# Patient Record
Sex: Female | Born: 1935 | Race: White | Hispanic: No | Smoking: Never smoker
Health system: Southern US, Community
[De-identification: ages and names within clinical notes are randomized; demographics above are authoritative.]

## PROBLEM LIST (undated history)

## (undated) DIAGNOSIS — E079 Disorder of thyroid, unspecified: Secondary | ICD-10-CM

## (undated) DIAGNOSIS — I10 Essential (primary) hypertension: Secondary | ICD-10-CM

## (undated) DIAGNOSIS — I4891 Unspecified atrial fibrillation: Secondary | ICD-10-CM

## (undated) DIAGNOSIS — F0390 Unspecified dementia without behavioral disturbance: Secondary | ICD-10-CM

## (undated) HISTORY — PX: HIP SURGERY: SHX245

---

## 2003-07-21 ENCOUNTER — Other Ambulatory Visit: Payer: Self-pay

## 2004-12-20 ENCOUNTER — Ambulatory Visit: Payer: Self-pay | Admitting: Internal Medicine

## 2006-01-16 ENCOUNTER — Ambulatory Visit: Payer: Self-pay | Admitting: Internal Medicine

## 2007-01-21 ENCOUNTER — Ambulatory Visit: Payer: Self-pay | Admitting: Internal Medicine

## 2007-05-15 ENCOUNTER — Ambulatory Visit: Payer: Self-pay | Admitting: Internal Medicine

## 2007-05-23 ENCOUNTER — Ambulatory Visit: Payer: Self-pay | Admitting: Internal Medicine

## 2007-05-24 ENCOUNTER — Ambulatory Visit: Payer: Self-pay | Admitting: Internal Medicine

## 2009-09-02 ENCOUNTER — Ambulatory Visit: Payer: Self-pay | Admitting: Unknown Physician Specialty

## 2009-10-04 ENCOUNTER — Ambulatory Visit: Payer: Self-pay | Admitting: Unknown Physician Specialty

## 2010-04-09 ENCOUNTER — Inpatient Hospital Stay: Payer: Self-pay | Admitting: Specialist

## 2010-04-12 ENCOUNTER — Encounter: Payer: Self-pay | Admitting: Internal Medicine

## 2010-04-12 LAB — PATHOLOGY REPORT

## 2010-04-27 ENCOUNTER — Ambulatory Visit: Payer: Self-pay | Admitting: Internal Medicine

## 2010-05-03 ENCOUNTER — Encounter: Payer: Self-pay | Admitting: Internal Medicine

## 2010-05-20 ENCOUNTER — Ambulatory Visit: Payer: Self-pay | Admitting: Internal Medicine

## 2010-06-02 ENCOUNTER — Encounter: Payer: Self-pay | Admitting: Internal Medicine

## 2012-03-28 IMAGING — CR DG HIP COMPLETE 2+V*L*
1 series · 3 of 3 positions shown · non-contrast
Comparison: none

REASON FOR EXAM: fall with pain
COMMENTS:

PROCEDURE:     DXR - DXR HIP LEFT COMPLETE  - April 09, 2010  [DATE]
RESULT:     Comparison: None.

[Series 1: view not recorded · 0.17mm/px · 3 of 3 slices shown]
[im 1/3]
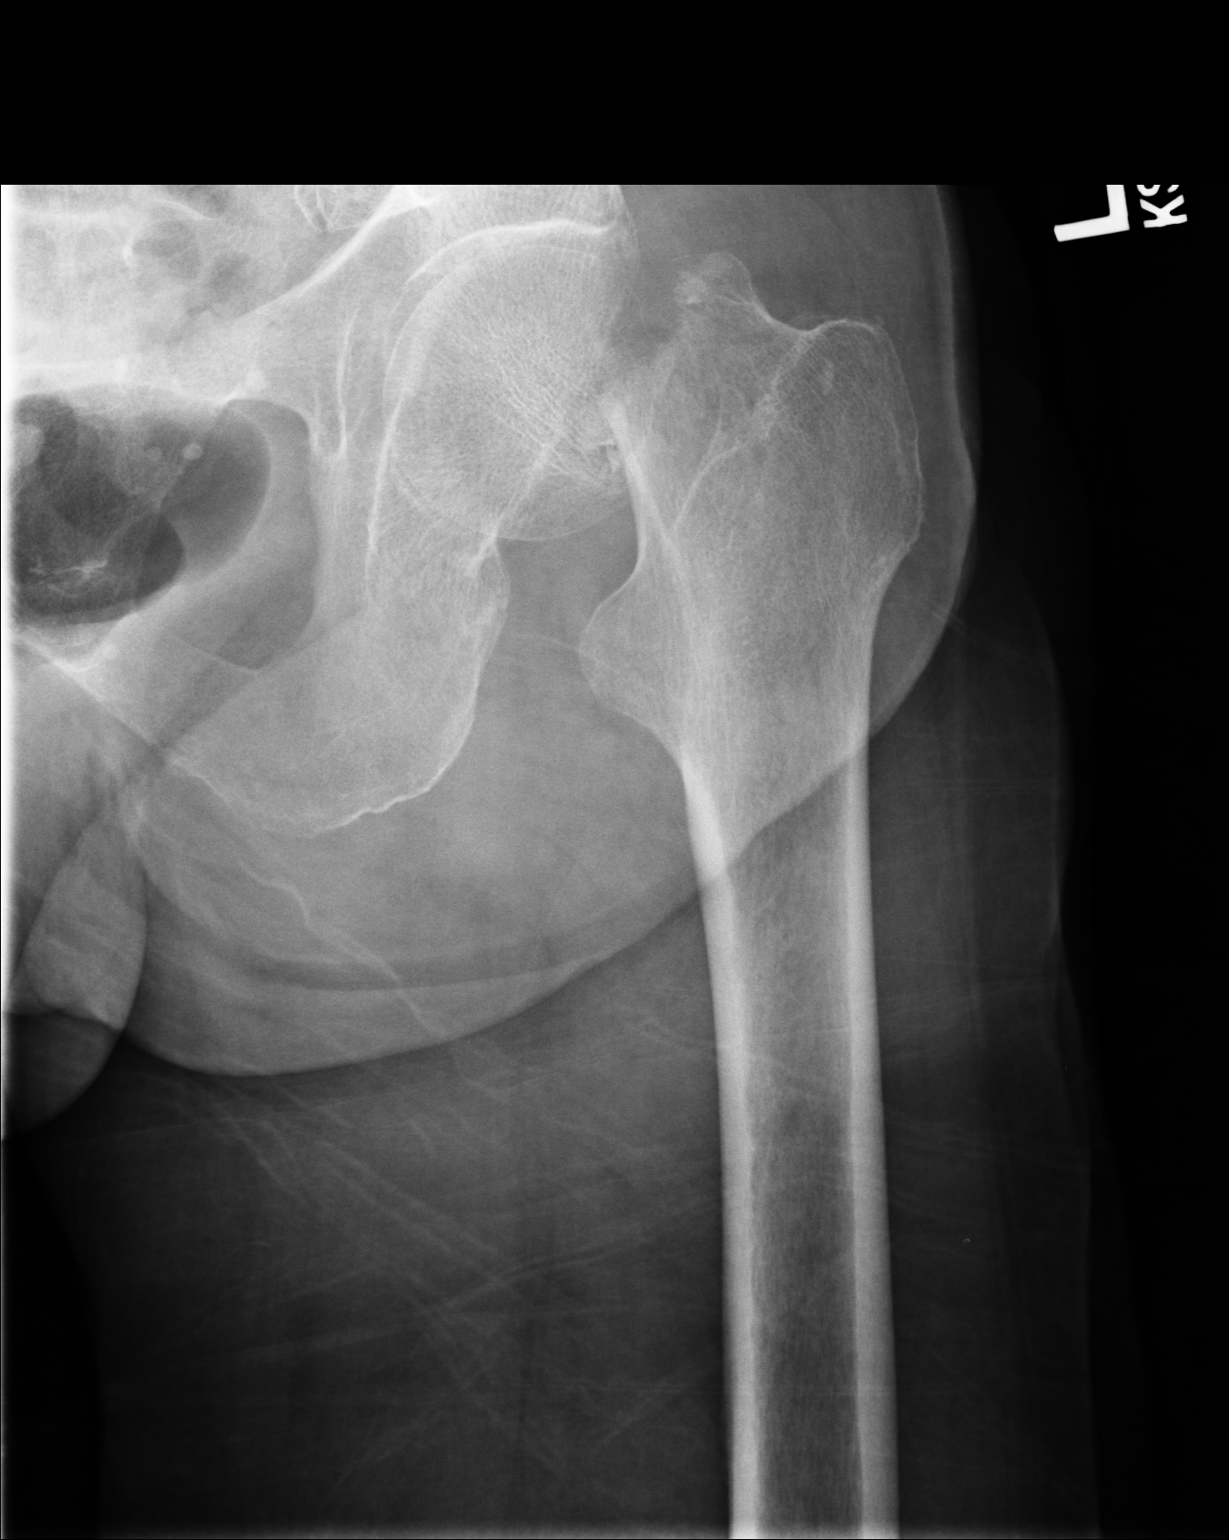
[im 2/3]
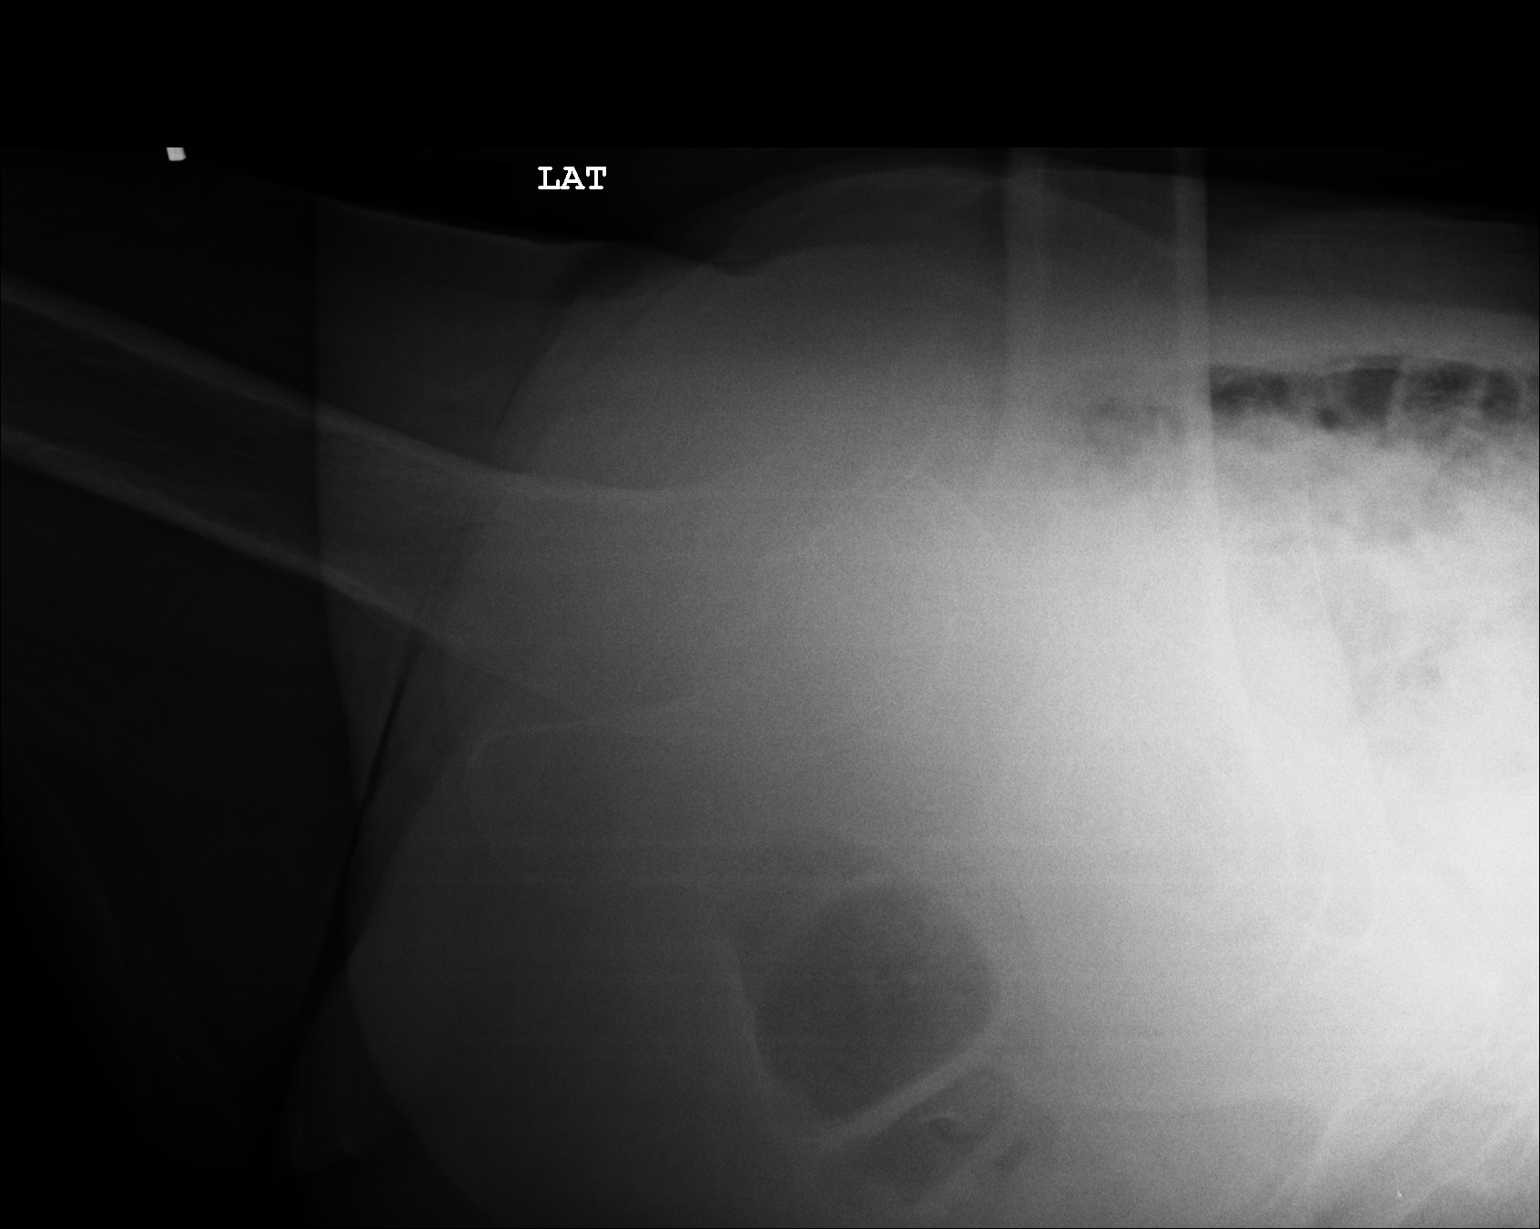
[im 3/3]
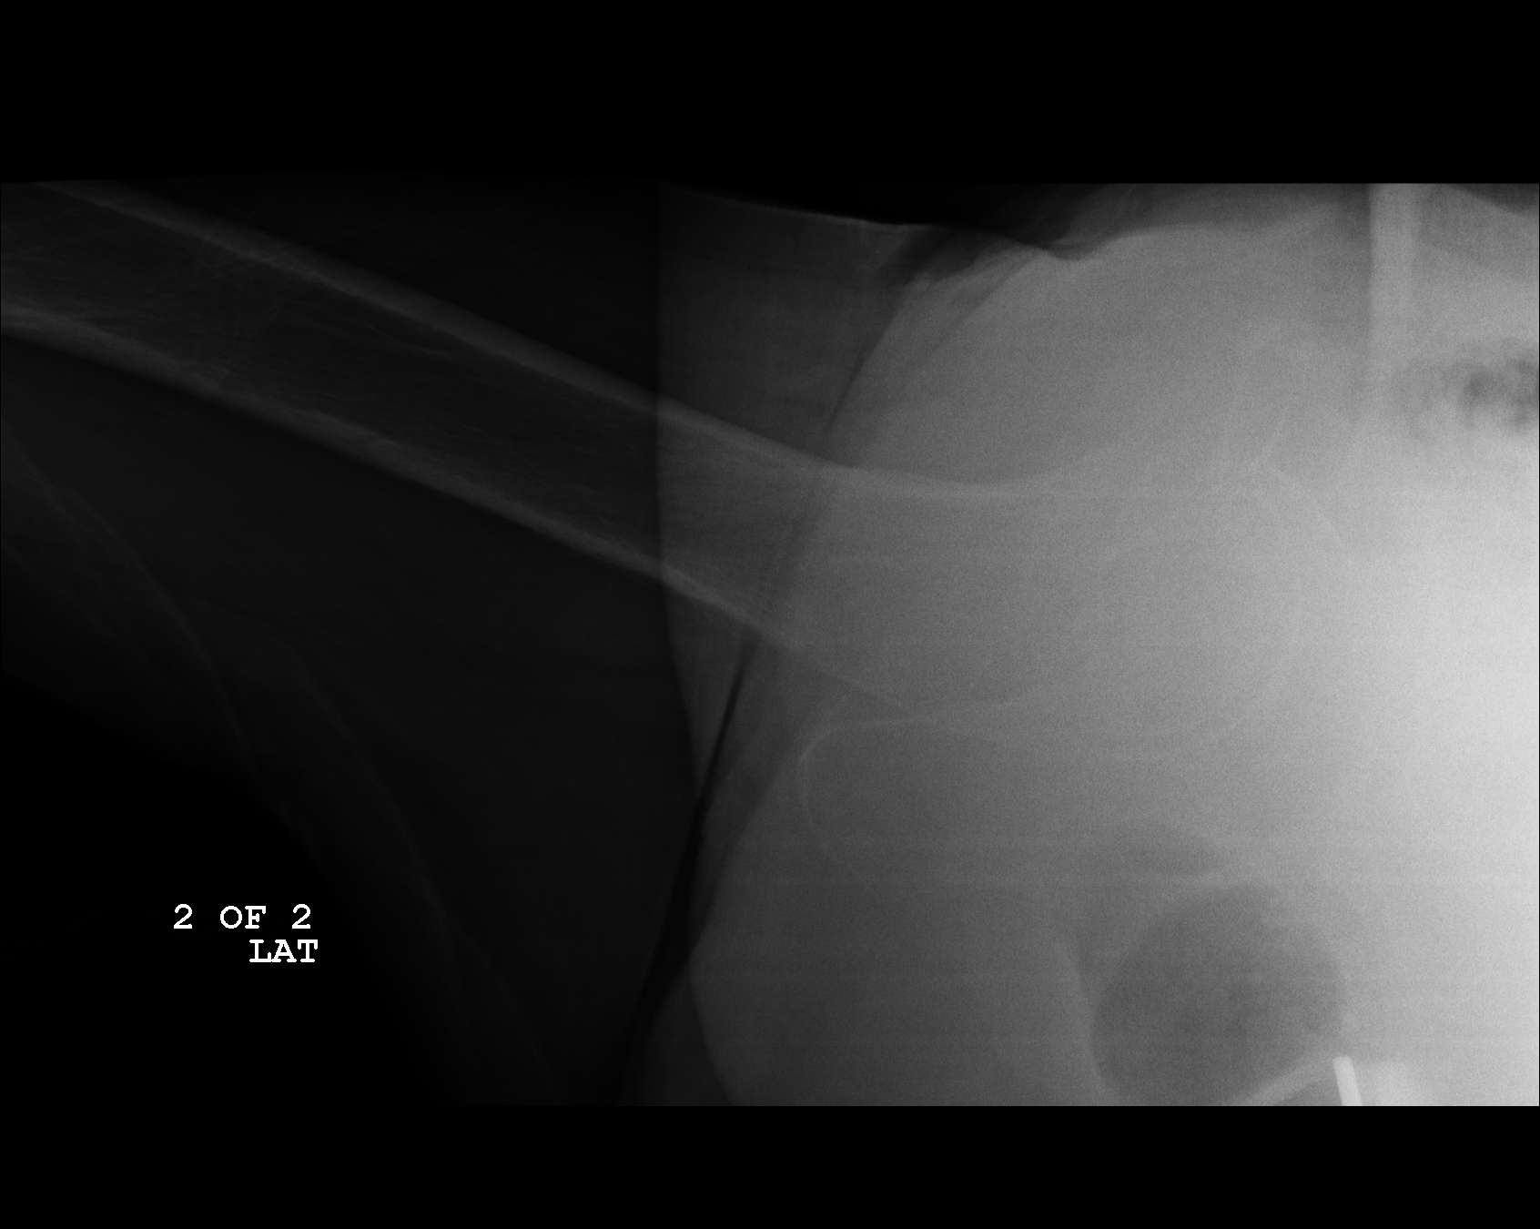

[3 of 3 positions shown; findings below may reference images not displayed]

FINDINGS: There is a displaced fracture the femoral neck, just below the femoral head.
IMPRESSION: Displaced subcapital femoral neck fracture.

## 2012-03-28 IMAGING — CR PELVIS - 1-2 VIEW
1 series · 1 of 1 positions shown · non-contrast
Comparison: none

REASON FOR EXAM: fall with pain
COMMENTS:

PROCEDURE:     DXR - DXR PELVIS AP ONLY  - April 09, 2010  [DATE]
RESULT:     Comparison: None

[view not recorded]
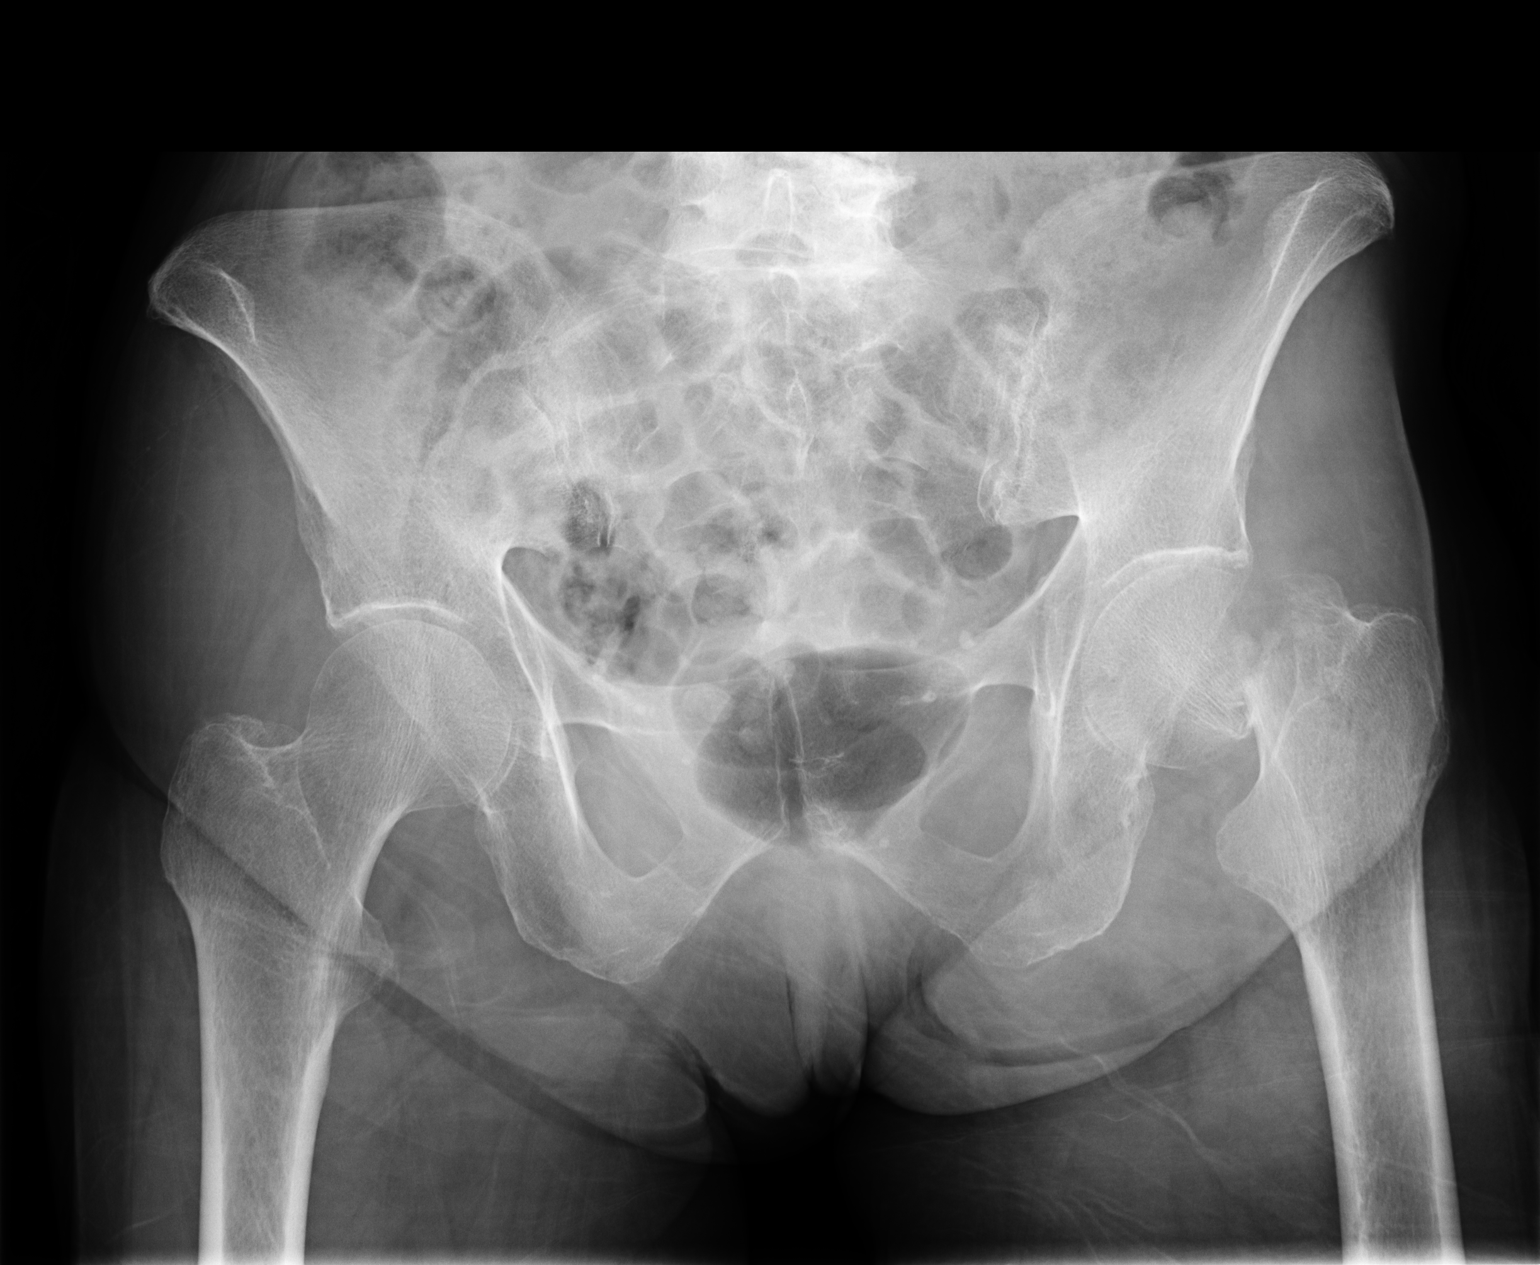

[1 of 1 positions shown; findings below may reference images not displayed]

FINDINGS: Displaced subcapital femoral neck fracture. Evaluation of the sacrum limited
by overlying stool and bowel gas. There are degenerative changes in the
lower lumbar spine.
IMPRESSION: Displaced left subcapital femoral neck fracture.

## 2012-03-28 IMAGING — CR DG CHEST 1V PORT
1 series · 1 of 1 positions shown · non-contrast
Comparison: none

REASON FOR EXAM: preop
COMMENTS:

PROCEDURE:     DXR - DXR PORTABLE CHEST SINGLE VIEW  - April 09, 2010  [DATE]
RESULT:     Comparison: None.

[view not recorded]
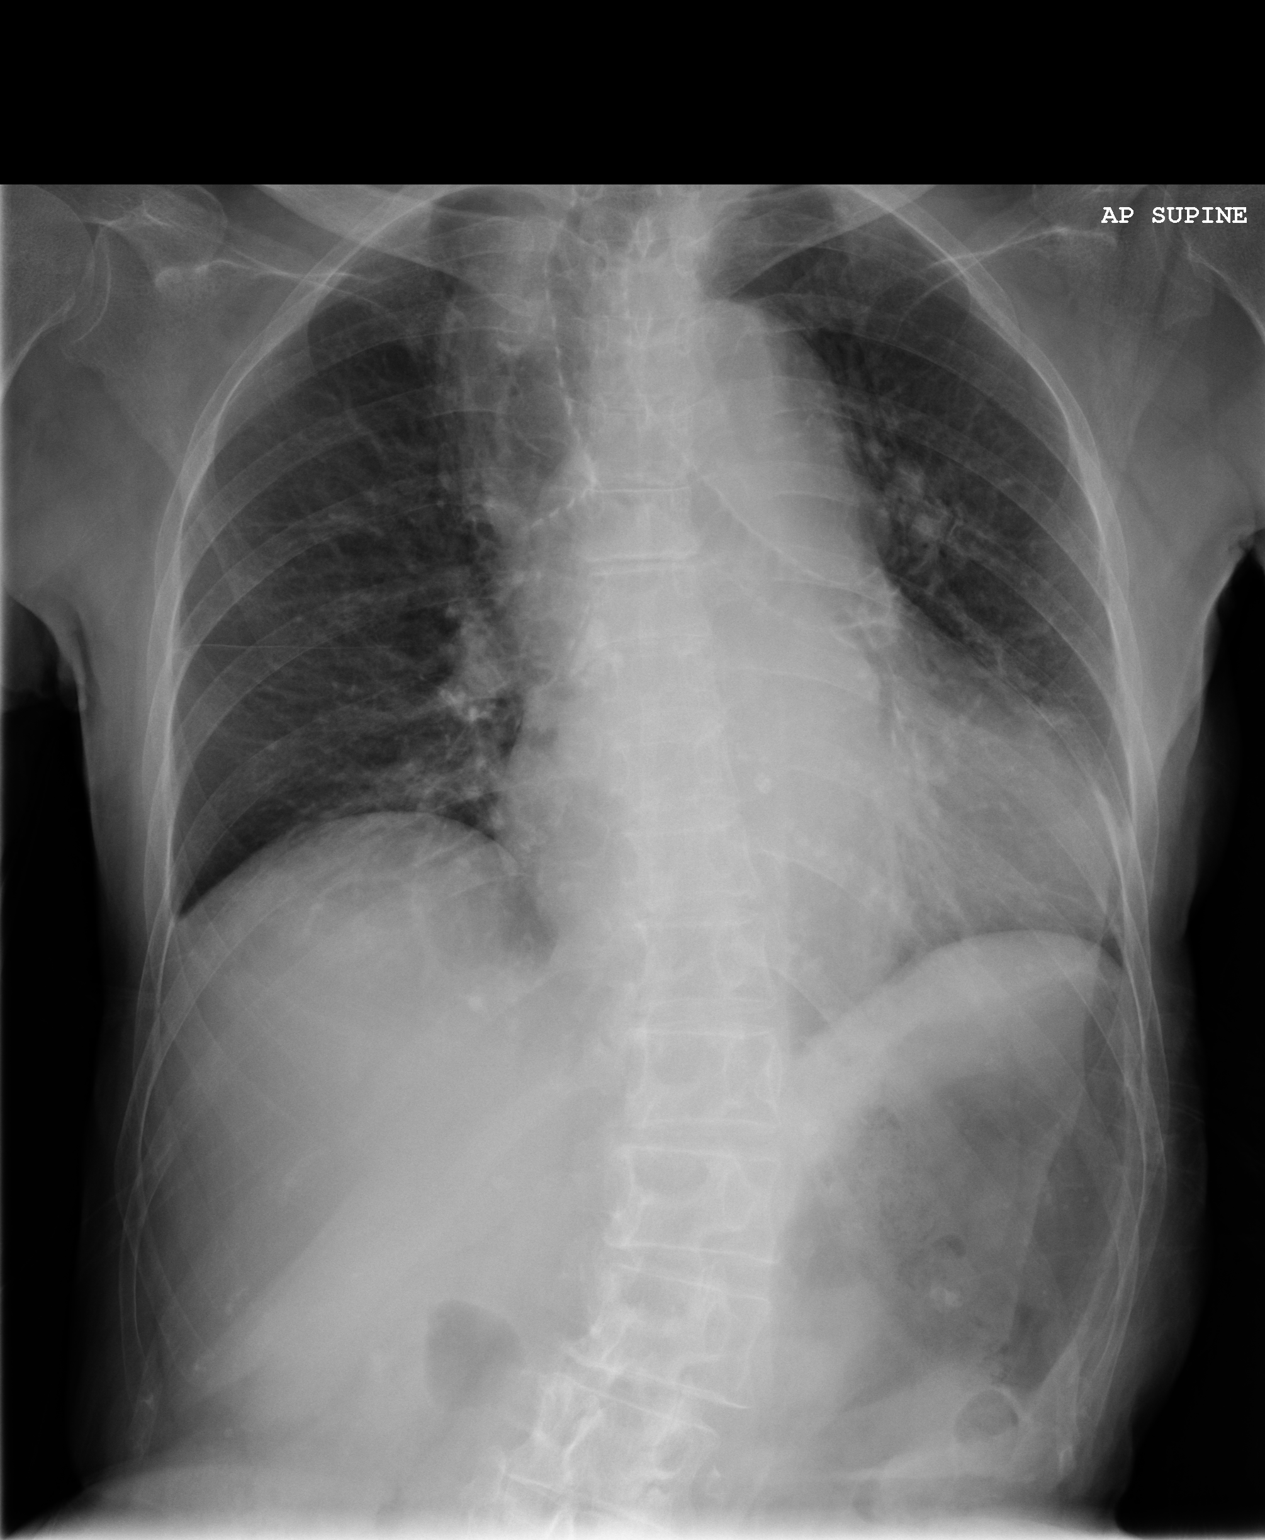

[1 of 1 positions shown; findings below may reference images not displayed]

FINDINGS: Mild cardiomegaly. Prominence of the right superior mediastinum is likely
related to tortuous great vessels. The lungs are clear. There is an S-shaped
curvature of the thoracolumbar spine.
IMPRESSION: 1. No acute cardiopulmonary disease.
2. Prominence of the right superior mediastinum likely related to tortuous
great vessels. Recommend comparison with outside prior chest radiographs to
ensure stability.

## 2012-03-28 IMAGING — CR DG HIP 1V*L*
1 series · 1 of 1 positions shown · non-contrast
Comparison: none

REASON FOR EXAM: post op
COMMENTS:   Bedside (portable):Y

PROCEDURE:     DXR - DXR HIP LEFT ONE VIEW  - April 09, 2010  [DATE]
RESULT:     Comparison: Earlier same day.

[view not recorded]
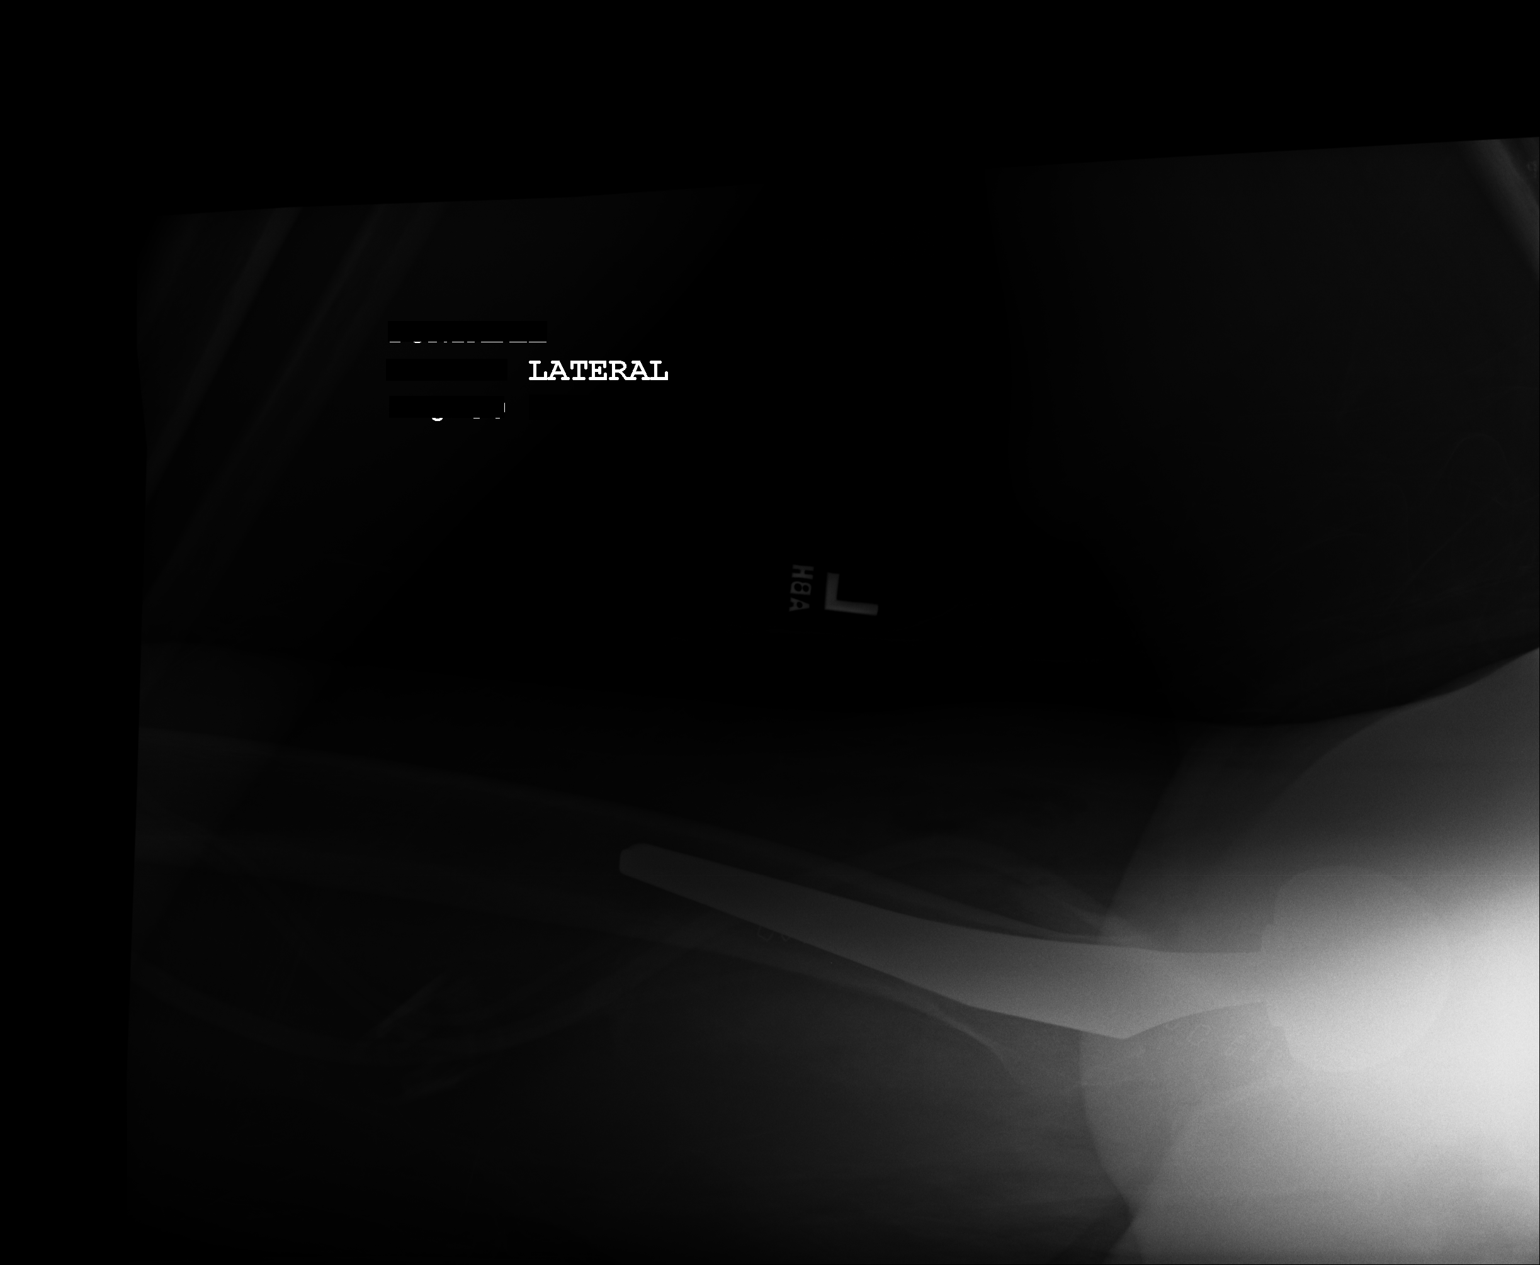

[1 of 1 positions shown; findings below may reference images not displayed]

FINDINGS: Evaluation limited by underpenetration. The femoral arthroplasty hardware
appears located, given the findings on the pelvis radiograph.
IMPRESSION: See above.

## 2012-04-15 IMAGING — CR DG ABDOMEN 3V
1 series · 4 of 4 positions shown · non-contrast
Comparison: none

REASON FOR EXAM: pain in lt lower side of abdomen
COMMENTS:

[Series 1: view not recorded · 0.17mm/px · 4 of 4 slices shown]
[im 1/4]
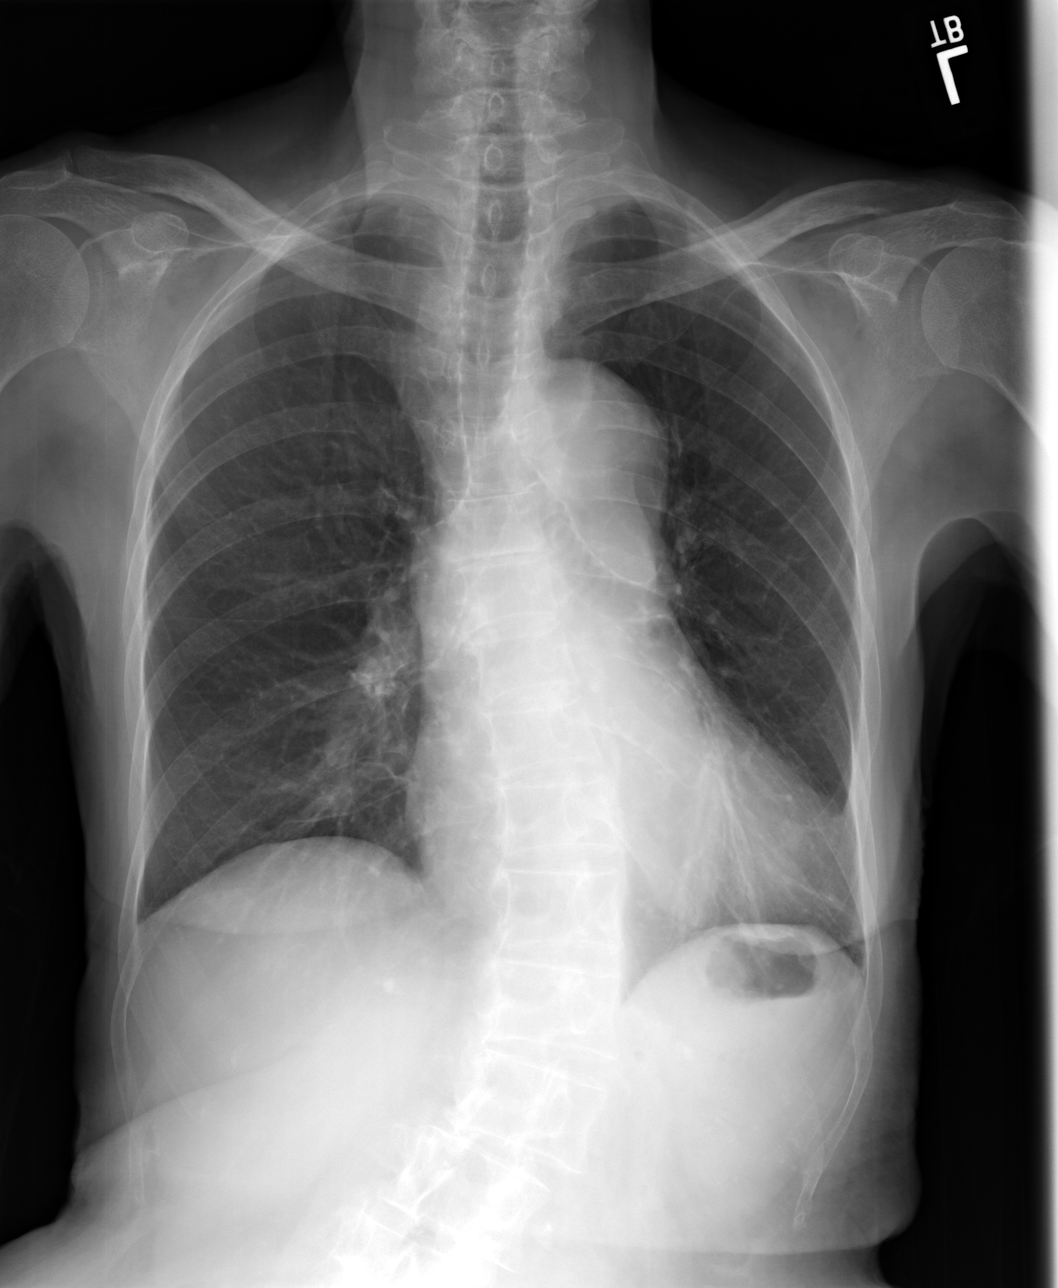
[im 2/4]
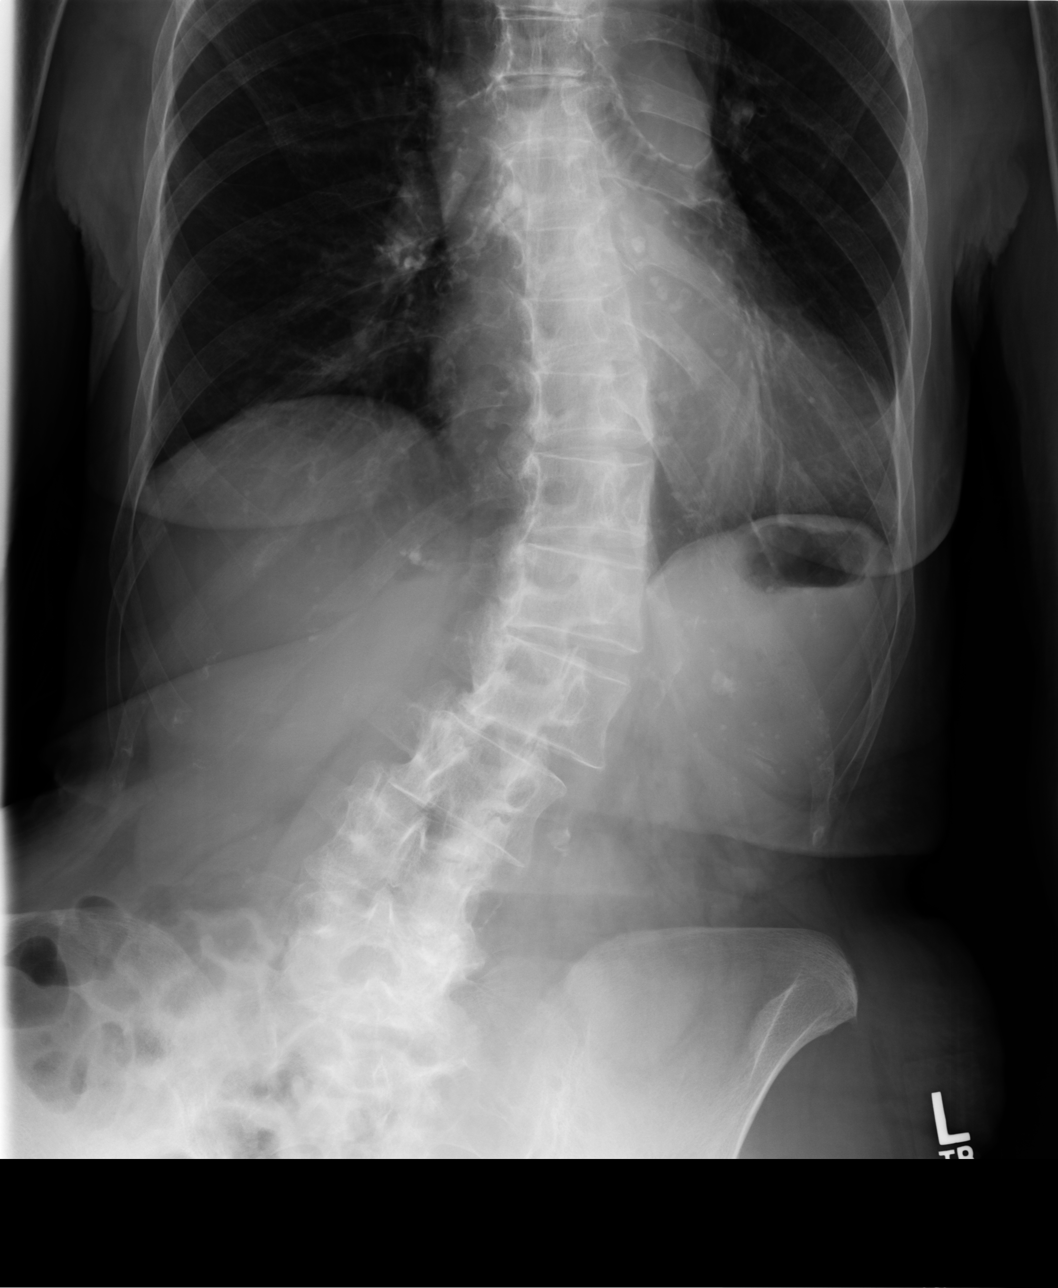
[im 3/4]
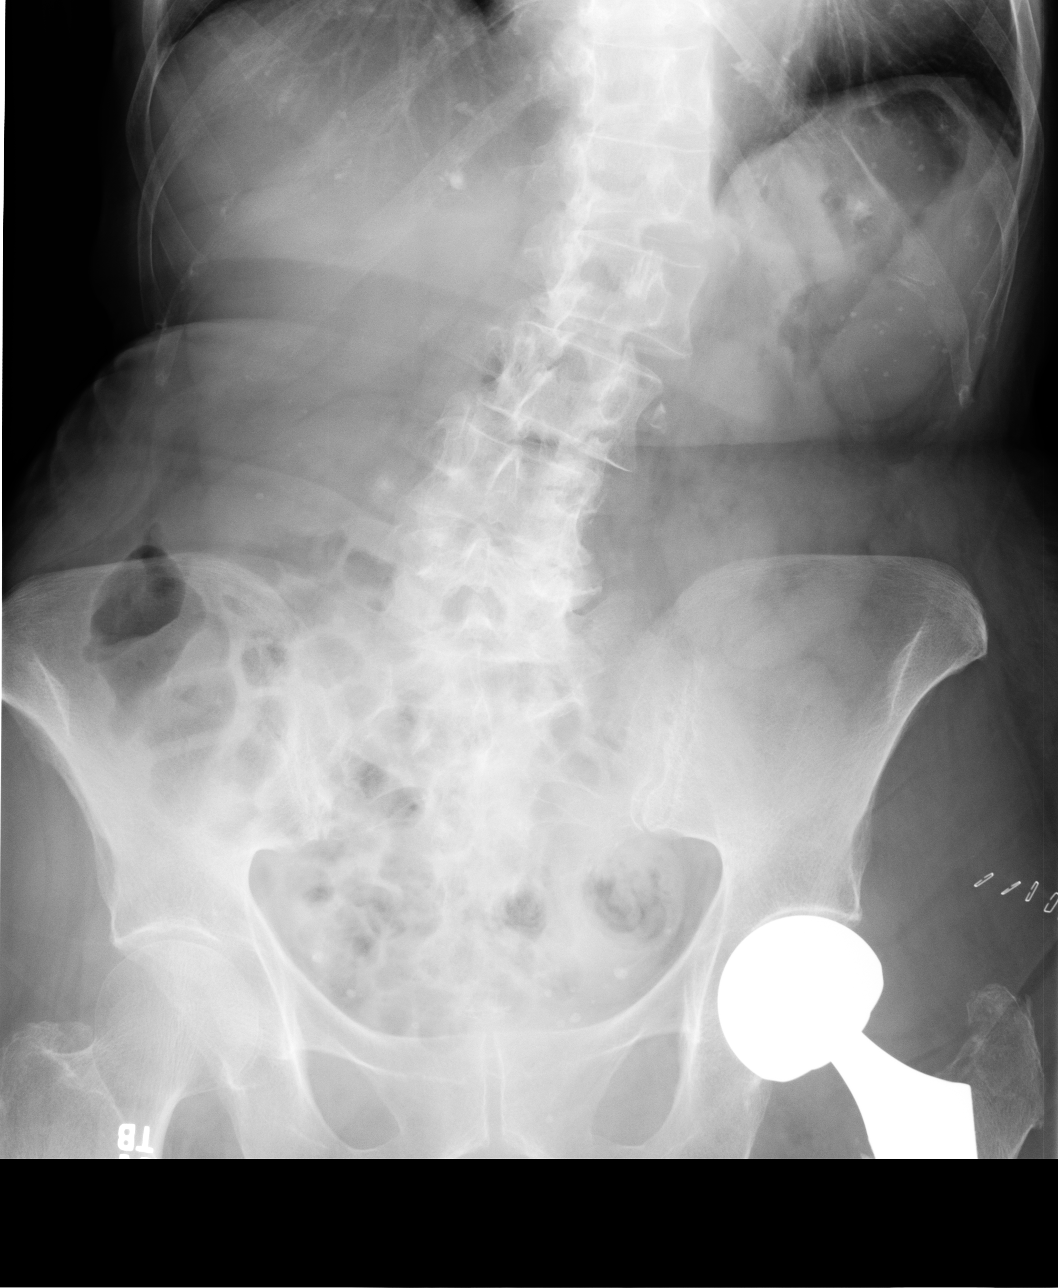
[im 4/4]
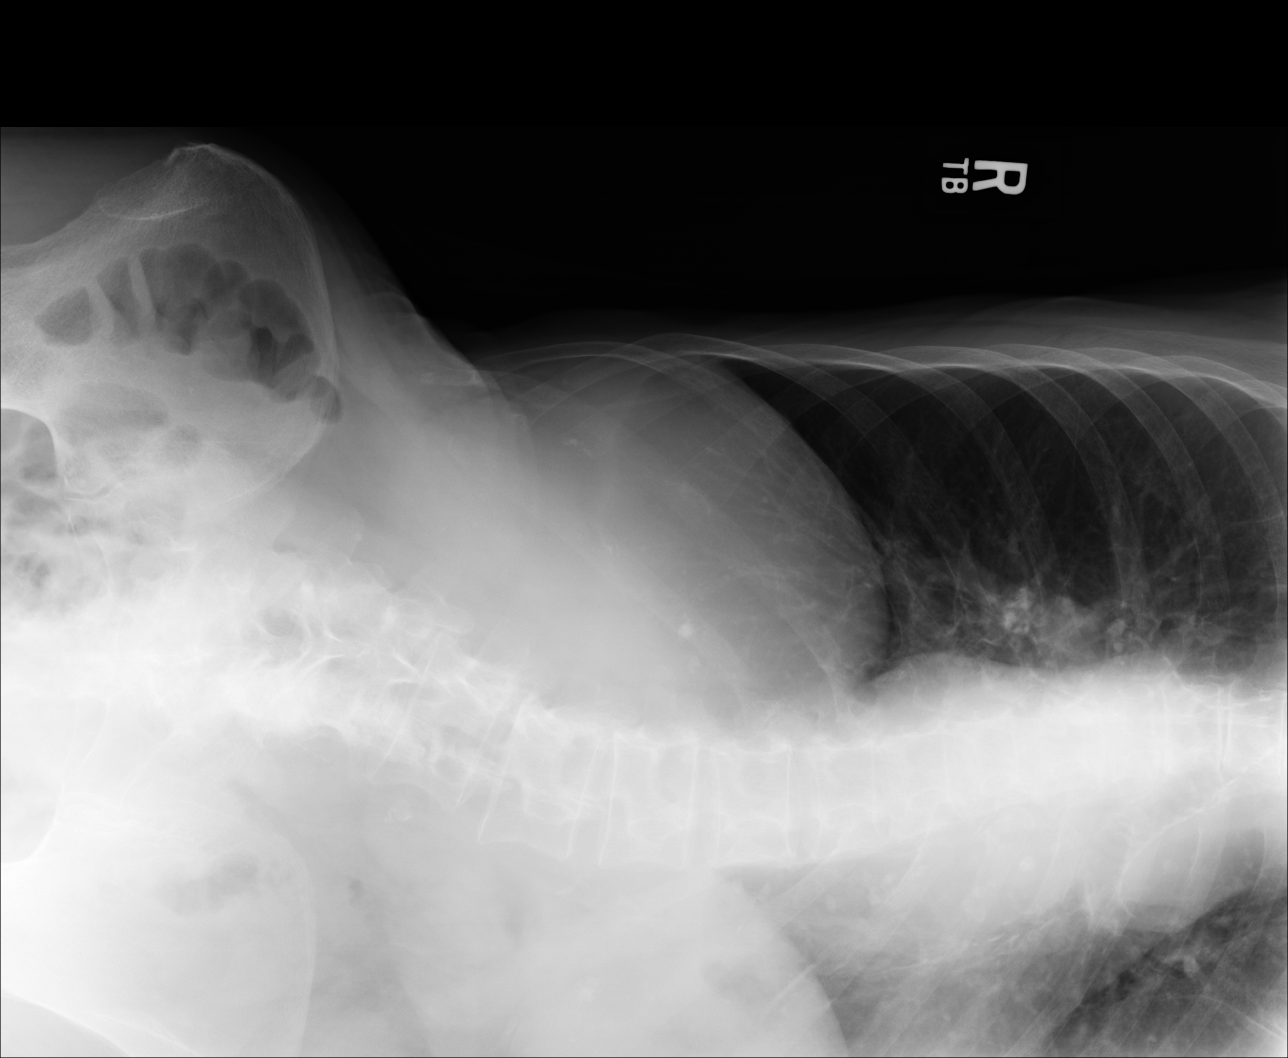

[4 of 4 positions shown; findings below may reference images not displayed]

PROCEDURE:     DXR - DXR ABDOMEN 3-WAY (INCL PA CXR)  - April 27, 2010 [DATE]

RESULT:     PA view of the chest shows the lung fields to be clear. The
heart is mildly enlarged. Multiple views of the abdomen show no
subdiaphragmatic free air. The bowel gas pattern is normal. No abnormal
intraabdominal calcifications are seen. There is a moderate thoracolumbar
scoliosis with the convexity to the left. The patient is status post prior
left hip replacement.
IMPRESSION: 1. No bowel obstruction or other acute change is seen.
2. No subdiaphragmatic free air is noted.
3. There is a moderate lumbar scoliosis. No acute bony abnormalities are
seen.
4. Mild cardiomegaly.

## 2015-08-21 ENCOUNTER — Encounter: Payer: Self-pay | Admitting: Emergency Medicine

## 2015-08-21 ENCOUNTER — Emergency Department: Payer: Medicare Other

## 2015-08-21 ENCOUNTER — Observation Stay
Admission: EM | Admit: 2015-08-21 | Discharge: 2015-08-24 | Disposition: A | Discharge status: Skilled Nursing Facility | Payer: Medicare Other | Attending: Internal Medicine | Admitting: Internal Medicine

## 2015-08-21 DIAGNOSIS — Z9889 Other specified postprocedural states: Secondary | ICD-10-CM | POA: Insufficient documentation

## 2015-08-21 DIAGNOSIS — R778 Other specified abnormalities of plasma proteins: Secondary | ICD-10-CM

## 2015-08-21 DIAGNOSIS — R410 Disorientation, unspecified: Secondary | ICD-10-CM | POA: Insufficient documentation

## 2015-08-21 DIAGNOSIS — Z79899 Other long term (current) drug therapy: Secondary | ICD-10-CM | POA: Diagnosis not present

## 2015-08-21 DIAGNOSIS — I10 Essential (primary) hypertension: Secondary | ICD-10-CM | POA: Insufficient documentation

## 2015-08-21 DIAGNOSIS — R7989 Other specified abnormal findings of blood chemistry: Secondary | ICD-10-CM

## 2015-08-21 DIAGNOSIS — I482 Chronic atrial fibrillation: Secondary | ICD-10-CM | POA: Insufficient documentation

## 2015-08-21 DIAGNOSIS — E876 Hypokalemia: Secondary | ICD-10-CM | POA: Insufficient documentation

## 2015-08-21 DIAGNOSIS — R748 Abnormal levels of other serum enzymes: Secondary | ICD-10-CM | POA: Insufficient documentation

## 2015-08-21 DIAGNOSIS — Z8249 Family history of ischemic heart disease and other diseases of the circulatory system: Secondary | ICD-10-CM | POA: Diagnosis not present

## 2015-08-21 DIAGNOSIS — B351 Tinea unguium: Secondary | ICD-10-CM | POA: Insufficient documentation

## 2015-08-21 DIAGNOSIS — E079 Disorder of thyroid, unspecified: Secondary | ICD-10-CM | POA: Diagnosis not present

## 2015-08-21 DIAGNOSIS — G9340 Encephalopathy, unspecified: Secondary | ICD-10-CM | POA: Diagnosis not present

## 2015-08-21 DIAGNOSIS — E860 Dehydration: Secondary | ICD-10-CM | POA: Diagnosis not present

## 2015-08-21 DIAGNOSIS — E039 Hypothyroidism, unspecified: Secondary | ICD-10-CM | POA: Insufficient documentation

## 2015-08-21 DIAGNOSIS — N3001 Acute cystitis with hematuria: Secondary | ICD-10-CM | POA: Insufficient documentation

## 2015-08-21 DIAGNOSIS — R531 Weakness: Secondary | ICD-10-CM

## 2015-08-21 HISTORY — DX: Essential (primary) hypertension: I10

## 2015-08-21 HISTORY — DX: Disorder of thyroid, unspecified: E07.9

## 2015-08-21 HISTORY — DX: Unspecified atrial fibrillation: I48.91

## 2015-08-21 LAB — BASIC METABOLIC PANEL
ANION GAP: 6 (ref 5–15)
BUN: 37 mg/dL — ABNORMAL HIGH (ref 6–20)
CALCIUM: 9.1 mg/dL (ref 8.9–10.3)
CO2: 31 mmol/L (ref 22–32)
CREATININE: 0.94 mg/dL (ref 0.44–1.00)
Chloride: 104 mmol/L (ref 101–111)
GFR, EST AFRICAN AMERICAN: 60 mL/min — AB (ref >60)
GFR, EST NON AFRICAN AMERICAN: 51 mL/min — AB (ref >60)
Glucose, Bld: 145 mg/dL — ABNORMAL HIGH (ref 65–99)
Potassium: 3.5 mmol/L (ref 3.5–5.1)
SODIUM: 141 mmol/L (ref 135–145)

## 2015-08-21 LAB — TROPONIN I: TROPONIN I: 0.14 ng/mL — AB (ref <0.031)

## 2015-08-21 LAB — CBC
HCT: 34 % — ABNORMAL LOW (ref 35.0–47.0)
HEMOGLOBIN: 11.4 g/dL — AB (ref 12.0–16.0)
MCH: 33.5 pg (ref 26.0–34.0)
MCHC: 33.6 g/dL (ref 32.0–36.0)
MCV: 99.6 fL (ref 80.0–100.0)
PLATELETS: 90 10*3/uL — AB (ref 150–440)
RBC: 3.41 MIL/uL — AB (ref 3.80–5.20)
RDW: 13 % (ref 11.5–14.5)
WBC: 5.5 10*3/uL (ref 3.6–11.0)

## 2015-08-21 MED ORDER — SODIUM CHLORIDE 0.9 % IV BOLUS (SEPSIS)
1000.0000 mL | Freq: Once | INTRAVENOUS | Status: AC
  Administered 2015-08-22: 1000 mL via INTRAVENOUS

## 2015-08-21 NOTE — ED Notes (Signed)
Pt. Lives at home alone. Pt. States feeling weaker today.  Pt. Denies n/v/d.

## 2015-08-22 ENCOUNTER — Encounter: Payer: Self-pay | Admitting: Emergency Medicine

## 2015-08-22 DIAGNOSIS — E860 Dehydration: Secondary | ICD-10-CM | POA: Diagnosis present

## 2015-08-22 LAB — TROPONIN I
TROPONIN I: 0.14 ng/mL — AB (ref <0.031)
TROPONIN I: 0.13 ng/mL — AB (ref <0.031)
TROPONIN I: 0.1 ng/mL — AB (ref <0.031)

## 2015-08-22 LAB — URINALYSIS COMPLETE WITH MICROSCOPIC (ARMC ONLY)
BILIRUBIN URINE: NEGATIVE
Bacteria, UA: NONE SEEN
GLUCOSE, UA: NEGATIVE mg/dL
KETONES UR: NEGATIVE mg/dL
NITRITE: NEGATIVE
PH: 7 (ref 5.0–8.0)
PROTEIN: NEGATIVE mg/dL
SPECIFIC GRAVITY, URINE: 1.01 (ref 1.005–1.030)

## 2015-08-22 LAB — DIGOXIN LEVEL

## 2015-08-22 LAB — BASIC METABOLIC PANEL
Anion gap: 6 (ref 5–15)
BUN: 32 mg/dL — AB (ref 6–20)
CALCIUM: 8.9 mg/dL (ref 8.9–10.3)
CO2: 31 mmol/L (ref 22–32)
CREATININE: 0.91 mg/dL (ref 0.44–1.00)
Chloride: 107 mmol/L (ref 101–111)
GFR calc Af Amer: >60 mL/min (ref >60)
GFR, EST NON AFRICAN AMERICAN: 54 mL/min — AB (ref >60)
GLUCOSE: 95 mg/dL (ref 65–99)
Potassium: 3.4 mmol/L — ABNORMAL LOW (ref 3.5–5.1)
SODIUM: 144 mmol/L (ref 135–145)

## 2015-08-22 LAB — CBC
HEMATOCRIT: 31.9 % — AB (ref 35.0–47.0)
Hemoglobin: 10.9 g/dL — ABNORMAL LOW (ref 12.0–16.0)
MCH: 34 pg (ref 26.0–34.0)
MCHC: 34 g/dL (ref 32.0–36.0)
MCV: 100 fL (ref 80.0–100.0)
PLATELETS: 87 10*3/uL — AB (ref 150–440)
RBC: 3.19 MIL/uL — ABNORMAL LOW (ref 3.80–5.20)
RDW: 12.7 % (ref 11.5–14.5)
WBC: 5.2 10*3/uL (ref 3.6–11.0)

## 2015-08-22 MED ORDER — DEXTROSE 5 % IV SOLN
1.0000 g | INTRAVENOUS | Status: DC
  Administered 2015-08-22 – 2015-08-24 (×3): 1 g via INTRAVENOUS
  Filled 2015-08-22 (×4): qty 10

## 2015-08-22 MED ORDER — ONDANSETRON HCL 4 MG/2ML IJ SOLN: 4.0000 mg | Freq: Four times a day (QID) | INTRAMUSCULAR | Status: DC | PRN

## 2015-08-22 MED ORDER — ACETAMINOPHEN 650 MG RE SUPP: 650.0000 mg | Freq: Four times a day (QID) | RECTAL | Status: DC | PRN

## 2015-08-22 MED ORDER — POTASSIUM CHLORIDE CRYS ER 20 MEQ PO TBCR
20.0000 meq | EXTENDED_RELEASE_TABLET | Freq: Once | ORAL | Status: AC
  Administered 2015-08-22: 20 meq via ORAL
  Filled 2015-08-22: qty 1

## 2015-08-22 MED ORDER — LISINOPRIL 20 MG PO TABS
20.0000 mg | ORAL_TABLET | Freq: Every day | ORAL | Status: DC
  Administered 2015-08-22 – 2015-08-24 (×3): 20 mg via ORAL
  Filled 2015-08-22 (×3): qty 1

## 2015-08-22 MED ORDER — ONDANSETRON HCL 4 MG PO TABS
4.0000 mg | ORAL_TABLET | Freq: Four times a day (QID) | ORAL | Status: DC | PRN
Start: 2015-08-22 — End: 2015-08-24

## 2015-08-22 MED ORDER — ENOXAPARIN SODIUM 60 MG/0.6ML ~~LOC~~ SOLN
1.0000 mg/kg | Freq: Every day | SUBCUTANEOUS | Status: DC
Start: 2015-08-22 — End: 2015-08-23
  Administered 2015-08-22: 45 mg via SUBCUTANEOUS
  Filled 2015-08-22 (×4): qty 0.6

## 2015-08-22 MED ORDER — METOPROLOL TARTRATE 25 MG PO TABS
25.0000 mg | ORAL_TABLET | Freq: Every day | ORAL | Status: DC
  Filled 2015-08-22: qty 1

## 2015-08-22 MED ORDER — SODIUM CHLORIDE 0.9% FLUSH
3.0000 mL | Freq: Two times a day (BID) | INTRAVENOUS | Status: DC
  Administered 2015-08-22 – 2015-08-24 (×4): 3 mL via INTRAVENOUS

## 2015-08-22 MED ORDER — SENNA 8.6 MG PO TABS
1.0000 | ORAL_TABLET | Freq: Two times a day (BID) | ORAL | Status: DC
  Administered 2015-08-22 – 2015-08-24 (×5): 8.6 mg via ORAL
  Filled 2015-08-22 (×5): qty 1

## 2015-08-22 MED ORDER — ASPIRIN 81 MG PO CHEW
324.0000 mg | CHEWABLE_TABLET | Freq: Once | ORAL | Status: AC
  Administered 2015-08-22: 324 mg via ORAL

## 2015-08-22 MED ORDER — SODIUM CHLORIDE 0.9 % IV SOLN
INTRAVENOUS | Status: DC
Start: 2015-08-22 — End: 2015-08-23
  Administered 2015-08-22 (×2): via INTRAVENOUS

## 2015-08-22 MED ORDER — LEVOTHYROXINE SODIUM 75 MCG PO TABS
75.0000 ug | ORAL_TABLET | Freq: Every day | ORAL | Status: DC
  Administered 2015-08-22 – 2015-08-24 (×3): 75 ug via ORAL
  Filled 2015-08-22 (×3): qty 1

## 2015-08-22 MED ORDER — ASPIRIN 81 MG PO CHEW
CHEWABLE_TABLET | ORAL | Status: AC
  Administered 2015-08-22: 324 mg via ORAL
  Filled 2015-08-22: qty 4

## 2015-08-22 MED ORDER — AMLODIPINE BESYLATE 5 MG PO TABS
5.0000 mg | ORAL_TABLET | Freq: Every day | ORAL | Status: DC
  Administered 2015-08-22: 5 mg via ORAL
  Filled 2015-08-22: qty 1

## 2015-08-22 MED ORDER — METOPROLOL SUCCINATE ER 25 MG PO TB24
25.0000 mg | ORAL_TABLET | Freq: Every day | ORAL | Status: DC
  Administered 2015-08-22 – 2015-08-23 (×2): 25 mg via ORAL
  Filled 2015-08-22 (×2): qty 1

## 2015-08-22 MED ORDER — DIGOXIN 125 MCG PO TABS
250.0000 ug | ORAL_TABLET | Freq: Every day | ORAL | Status: DC
  Filled 2015-08-22: qty 2

## 2015-08-22 MED ORDER — ACETAMINOPHEN 325 MG PO TABS: 650.0000 mg | ORAL_TABLET | Freq: Four times a day (QID) | ORAL | Status: DC | PRN

## 2015-08-22 NOTE — Progress Notes (Signed)
Physical Therapy Treatment Patient Details Name: Melanie Tanner MRN: [id number] DOB: May 12, 1922 Today's Date: 08/22/2015    History of Present Illness Pt is here with aletered mental status, dehydration.      PT Comments    Pt is confused t/o PT exam and is not able to fully follow along with all requested acts.  She displays general weakness, needs some assist with mobility and lacks confidence with standing/ambulation ultimately needing direct assist and much encouragement to do even minimal ambulation.   Follow Up Recommendations  SNF     Equipment Recommendations       Recommendations for Other Services       Precautions / Restrictions Precautions Precautions: Fall Restrictions Weight Bearing Restrictions: No    Mobility  Bed Mobility Overal bed mobility: Needs Assistance Bed Mobility: Supine to Sit;Sit to Supine     Supine to sit: Min assist Sit to supine: Mod assist   General bed mobility comments: Pt needs assist getting to EOB, asks for assist getting legs back into bed  Transfers Overall transfer level: Needs assistance Equipment used: 1 person hand held assist Transfers: Sit to/from Stand Sit to Stand: Min assist         General transfer comment: Pt makes one attempt to get up at EOB w/o assist, was unable.  Did rise with single hand-held assist  Ambulation/Gait Ambulation/Gait assistance: Mod assist Ambulation Distance (Feet): 6 Feet Assistive device: 1 person hand held assist       General Gait Details: Pt initially stated she thought she could walk w/o AD, she was reliant on using PTs hand to maintain balance and was generally unsteady and not confident with ambulation.    Stairs            Wheelchair Mobility    Modified Rankin (Stroke Patients Only)       Balance                                    Cognition Arousal/Alertness: Awake/alert Behavior During Therapy: Impulsive Overall Cognitive Status:  Impaired/Different from baseline                      Exercises      General Comments        Pertinent Vitals/Pain Pain Assessment: No/denies pain    Home Living Family/patient expects to be discharged to:: Private residence Living Arrangements: Alone Available Help at Discharge: Family (son checks in about every other day)   Home Access:  (pt unable to answer appropriately)     Home Equipment: Cane - single point;Walker - 2 wheels (at times does not even use her cane)      Prior Function Level of Independence: Independent with assistive device(s)      Comments: family is involved, she does not get out of the house and does only minimal in-home errands/chores   PT Goals (current goals can now be found in the care plan section) Acute Rehab PT Goals Patient Stated Goal: pt wants to go home PT Goal Formulation: Patient unable to participate in goal setting Time For Goal Achievement: 09/05/15 Potential to Achieve Goals: Fair    Frequency  Min 2X/week    PT Plan      Co-evaluation             End of Session Equipment Utilized During Treatment: Gait belt Activity Tolerance: Patient limited by fatigue (  difficulty following instructions secondary to AMS) Patient left: with call bell/phone within reach;with bed alarm set     Time: [phone] PT Time Calculation (min) (ACUTE ONLY): 19 min  Charges:                       G Codes:  Functional Assessment Tool Used: clinical judgement Functional Limitation: Mobility: Walking and moving around Mobility: Walking and Moving Around Current Status [phone]): At least 40 percent but less than 60 percent impaired, limited or restricted Mobility: Walking and Moving Around Goal Status [phone]): At least 1 percent but less than 20 percent impaired, limited or restricted   Loran Senters, PT, DPT [id number]  Malachi Pro 08/22/2015, 3:13 PM

## 2015-08-22 NOTE — H&P (Signed)
[facility] - [place] at [facility]   PATIENT NAME: Melanie Tanner    MR#:  [id number]  DATE OF BIRTH:  [birth year]  DATE OF ADMISSION:  08/21/2015  PRIMARY CARE PHYSICIAN: No primary care provider on file.   REQUESTING/REFERRING PHYSICIAN:   CHIEF COMPLAINT:   Chief Complaint  Patient presents with  . Weakness    Pt. here via EMS from home.  Pt. states feeling weak today.    HISTORY OF PRESENT ILLNESS: Melanie Tanner  is a 82 y.o. female with a known history of hypertension, chronic atrial fibrillation hypothyroidism presented to the emergency room for generalized weakness. Patient lives alone at home. Patients son lives in [place] and takes care of her. In feeling weak and tired. No complains of chest pain. No shortness of breath. No abdominal pain nausea vomiting or diarrhea. No fever or chills or cough. No headache dizziness or blurry vision. She was worked up in the emergency room wasn't dehydrated. Her first set of troponin was slightly elevated. EKG did not show any ST segment abnormality. No history of fall or head injury. No history of any slurred speech or any difficulty swallowing.  PAST MEDICAL HISTORY:   Past Medical History  Diagnosis Date  . Hypertension   . Thyroid disease   . Atrial fibrillation (HCC)     PAST SURGICAL HISTORY: Past Surgical History  Procedure Laterality Date  . Hip surgery      SOCIAL HISTORY:  Social History  Substance Use Topics  . Smoking status: Never Smoker   . Smokeless tobacco: Not on file  . Alcohol Use: No    FAMILY HISTORY:  Family History  Problem Relation Age of Onset  . Hypertension Son     DRUG ALLERGIES: No Known Allergies  REVIEW OF SYSTEMS:   CONSTITUTIONAL: No fever, has weakness.  EYES: No blurred or double vision.  EARS, NOSE, AND THROAT: No tinnitus or ear pain.  RESPIRATORY: No cough, shortness of breath, wheezing or hemoptysis.  CARDIOVASCULAR: No chest pain, orthopnea, edema.   GASTROINTESTINAL: No nausea, vomiting, diarrhea or abdominal pain.  GENITOURINARY: No dysuria, hematuria.  ENDOCRINE: No polyuria, nocturia,  HEMATOLOGY: No anemia, easy bruising or bleeding SKIN: No rash or lesion. MUSCULOSKELETAL: No joint pain or arthritis.  Right shoulder pain noted. NEUROLOGIC: No tingling, numbness, weakness.  PSYCHIATRY: No anxiety or depression.   MEDICATIONS AT HOME:  Prior to Admission medications   Medication Sig Start Date End Date Taking? Authorizing Provider  amLODipine (NORVASC) 5 MG tablet Take 1 tablet by mouth daily. 08/13/15  Yes Historical Provider, MD  digoxin (LANOXIN) 0.25 MG tablet Take 1 tablet by mouth daily. 08/13/15  Yes Historical Provider, MD  levothyroxine (SYNTHROID, LEVOTHROID) 75 MCG tablet Take 1 tablet by mouth daily. 08/13/15  Yes Historical Provider, MD  lisinopril (PRINIVIL,ZESTRIL) 20 MG tablet Take 1 tablet by mouth daily. 08/13/15  Yes Historical Provider, MD  metoprolol tartrate (LOPRESSOR) 25 MG tablet Take 1 tablet by mouth daily. 08/13/15  Yes Historical Provider, MD      PHYSICAL EXAMINATION:   VITAL SIGNS: Blood pressure 154/68, pulse 52, temperature 97.9 F (36.6 C), temperature source Oral, resp. rate 15, height  (1.626 m), weight 45.36 kg (100 lb), SpO2 99 %.  GENERAL:  82 y.o.-year-old patient lying in the bed with no acute distress.  EYES: Pupils equal, round, reactive to light and accommodation. No scleral icterus. Extraocular muscles intact.  HEENT: Head atraumatic, normocephalic. Oropharynx and nasopharynx clear.  NECK:  Supple, no jugular venous distention. No thyroid enlargement, no tenderness.  LUNGS: Normal breath sounds bilaterally, no wheezing, rales,rhonchi or crepitation. No use of accessory muscles of respiration.  CARDIOVASCULAR: S1, S2 normal. No murmurs, rubs, or gallops.  ABDOMEN: Soft, nontender, nondistended. Bowel sounds present. No organomegaly or mass.  EXTREMITIES: No pedal edema, cyanosis,  or clubbing. Tenderness right soulder NEUROLOGIC: Cranial nerves II through XII are intact. Muscle strength 5/5 in all extremities. Sensation intact. No cerebellar signs noted. PSYCHIATRIC: The patient is alert and oriented x 3.  SKIN: No obvious rash, lesion, or ulcer.   LABORATORY PANEL:   CBC  Recent Labs Lab 08/21/15 2231  WBC 5.5  HGB 11.4*  HCT 34.0*  PLT 90*  MCV 99.6  MCH 33.5  MCHC 33.6  RDW 13.0   ------------------------------------------------------------------------------------------------------------------  Chemistries   Recent Labs Lab 08/21/15 2231  NA 141  K 3.5  CL 104  CO2 31  GLUCOSE 145*  BUN 37*  CREATININE 0.94  CALCIUM 9.1   ------------------------------------------------------------------------------------------------------------------ estimated creatinine clearance is 27.9 mL/min (by C-G formula based on Cr of 0.94). ------------------------------------------------------------------------------------------------------------------ No results for input(s): TSH, T4TOTAL, T3FREE, THYROIDAB in the last 72 hours.  Invalid input(s): FREET3   Coagulation profile No results for input(s): INR, PROTIME in the last 168 hours. ------------------------------------------------------------------------------------------------------------------- No results for input(s): DDIMER in the last 72 hours. -------------------------------------------------------------------------------------------------------------------  Cardiac Enzymes  Recent Labs Lab 08/21/15 2231  TROPONINI 0.14*   ------------------------------------------------------------------------------------------------------------------ Invalid input(s): POCBNP  ---------------------------------------------------------------------------------------------------------------  Urinalysis No results found for: COLORURINE, APPEARANCEUR, LABSPEC, PHURINE, GLUCOSEU, HGBUR, BILIRUBINUR, KETONESUR,  PROTEINUR, UROBILINOGEN, NITRITE, LEUKOCYTESUR   RADIOLOGY: Ct Head Wo Contrast  08/21/2015  CLINICAL DATA:  Weakness today. EXAM: CT HEAD WITHOUT CONTRAST TECHNIQUE: Contiguous axial images were obtained from the base of the skull through the vertex without intravenous contrast. COMPARISON:  None. FINDINGS: Diffuse cerebral atrophy. Ventricular dilatation consistent with central atrophy. Low-attenuation changes in the deep white matter consistent with small vessel ischemia. In the left medial inferior cerebellum, there is a 19.5 mm area of low attenuation with some mass effect on the adjacent fourth ventricle. This is nonspecific and could represent infarct, mass, or abscess. MRI suggested for further evaluation. No acute intracranial hemorrhage. No abnormal extra-axial fluid collections. Gray-white matter junctions are distinct. Basal cisterns are not effaced. Calvarium appears intact. Mild mucosal thickening in the paranasal sinuses. Mastoid air cells are not opacified. Vascular calcifications. IMPRESSION: Nonspecific low-attenuation area in the left medial inferior cerebellum. MRI suggested for further evaluation. Diffuse cerebral atrophy and small vessel ischemic changes. No acute intracranial hemorrhage. These results were called by telephone at the time of interpretation on 08/21/2015 at 11:51 pm to Dr. Lucrezia Europe , who verbally acknowledged these results. Electronically Signed   By: Burman Nieves M.D.   On: 08/21/2015 23:53    EKG: Orders placed or performed during the hospital encounter of 08/21/15  . ED EKG  . ED EKG  . EKG 12-Lead  . EKG 12-Lead    IMPRESSION AND PLAN: 82 year old female patient with history of hypertension, hypothyroidism, chronic atrial fibrillation presented to the emergency room with weakness. First set of troponin was mildly elevated. Admitting diagnosis 1. Dehydration 2. Hypokalemia 3. Abnormal troponin 4. Hypertension 5. Hypothyroidism Treatment  plan Admit patient to telemetry IV fluid hydration Cycle troponin to rule out any cardiac ischemia Anticoagulate patient with full dose Lovenox for now Resume blood pressure medication Replace potassium Supportive care.  All the records are reviewed and case discussed with ED provider. Management  plans discussed with the patient, family and they are in agreement.  CODE STATUS:FULL Code Status History    This patient does not have a recorded code status. Please follow your organizational policy for patients in this situation.       TOTAL TIME TAKING CARE OF THIS PATIENT: .    Ihor Austin M.D on 08/22/2015 at 1:55 AM  Between 7am to 6pm - Pager - [phone]  After 6pm go to [email or web address] - password [email or web address] [facility]  [facility]  Office  [phone]  CC: Primary care physician; No primary care provider on file.

## 2015-08-22 NOTE — Progress Notes (Signed)
Patient ID: Melanie Tanner, female   DOB: 12/20/1921, 82 y.o.   MRN: [id number] [facility] PROGRESS NOTE  Melanie Tanner DOB: [birth year] DOA: 08/21/2015 PCP: No primary care provider on file.  HPI/Subjective: Patient could not tell me why she is in the hospital. I spoke with the son, and he stated the patient has been weak at home and the patient wanted to come to the hospital for further evaluation. As per the nursing staff she is using a whole roll of toilet paper to wipe her self after urination and his wiping very hard that she is raw in that area. Patient very upset about not being able to go to the bathroom.  Objective: Filed Vitals:   08/22/15 1205 08/22/15 1319  BP: 146/54 163/71  Pulse: 63 72  Temp:  98.1 F (36.7 C)  Resp:  20    Filed Weights   08/21/15 2153 08/22/15 0328  Weight: 45.36 kg (100 lb) 46.494 kg (102 lb 8 oz)    ROS: Review of Systems  Constitutional: Negative for fever and chills.  Eyes: Negative for blurred vision.  Respiratory: Negative for cough and shortness of breath.   Cardiovascular: Negative for chest pain.  Gastrointestinal: Negative for nausea, vomiting, abdominal pain, diarrhea and constipation.  Genitourinary: Positive for dysuria.  Musculoskeletal: Negative for joint pain.  Neurological: Positive for weakness. Negative for dizziness and headaches.   Exam: Physical Exam  HENT:  Nose: No mucosal edema.  Mouth/Throat: No oropharyngeal exudate or posterior oropharyngeal edema.  Eyes: Conjunctivae, EOM and lids are normal. Pupils are equal, round, and reactive to light.  Neck: No JVD present. Carotid bruit is not present. No edema present. No thyroid mass and no thyromegaly present.  Cardiovascular: S1 normal and S2 normal.  Exam reveals no gallop.   No murmur heard. Pulses:      Dorsalis pedis pulses are 2+ on the right side, and 2+ on the left side.  Respiratory: No respiratory distress. She has no wheezes.  She has no rhonchi. She has no rales.  GI: Soft. Bowel sounds are normal. There is no tenderness.  Musculoskeletal:       Right ankle: She exhibits no swelling.       Left ankle: She exhibits no swelling.  Lymphadenopathy:    She has no cervical adenopathy.  Neurological: She is alert. No cranial nerve deficit.  Able to straight leg raise bilaterally. Able to lift up arms. Follows commands.  Skin: Skin is warm. No rash noted. Nails show no clubbing.  Psychiatric: Her mood appears anxious.      Data Reviewed: Basic Metabolic Panel:  Recent Labs Lab 08/21/15 2231 08/22/15 0418  NA 141 144  K 3.5 3.4*  CL 104 107  CO2 31 31  GLUCOSE 145* 95  BUN 37* 32*  CREATININE 0.94 0.91  CALCIUM 9.1 8.9   CBC:  Recent Labs Lab 08/21/15 2231 08/22/15 0418  WBC 5.5 5.2  HGB 11.4* 10.9*  HCT 34.0* 31.9*  MCV 99.6 100.0  PLT 90* 87*   Cardiac Enzymes:  Recent Labs Lab 08/21/15 2231 08/22/15 0418 08/22/15 1002  TROPONINI 0.14* 0.14* 0.13*     Studies: Ct Head Wo Contrast  08/21/2015  CLINICAL DATA:  Weakness today. EXAM: CT HEAD WITHOUT CONTRAST TECHNIQUE: Contiguous axial images were obtained from the base of the skull through the vertex without intravenous contrast. COMPARISON:  None. FINDINGS: Diffuse cerebral atrophy. Ventricular dilatation consistent with central atrophy. Low-attenuation changes in the deep  white matter consistent with small vessel ischemia. In the left medial inferior cerebellum, there is a 19.5 mm area of low attenuation with some mass effect on the adjacent fourth ventricle. This is nonspecific and could represent infarct, mass, or abscess. MRI suggested for further evaluation. No acute intracranial hemorrhage. No abnormal extra-axial fluid collections. Gray-white matter junctions are distinct. Basal cisterns are not effaced. Calvarium appears intact. Mild mucosal thickening in the paranasal sinuses. Mastoid air cells are not opacified. Vascular  calcifications. IMPRESSION: Nonspecific low-attenuation area in the left medial inferior cerebellum. MRI suggested for further evaluation. Diffuse cerebral atrophy and small vessel ischemic changes. No acute intracranial hemorrhage. These results were called by telephone at the time of interpretation on 08/21/2015 at 11:51 pm to Dr. Lucrezia Europe , who verbally acknowledged these results. Electronically Signed   By: Burman Nieves M.D.   On: 08/21/2015 23:53    Scheduled Meds: . amLODipine  5 mg Oral Daily  . cefTRIAXone (ROCEPHIN)  IV  1 g Intravenous Q24H  . enoxaparin (LOVENOX) injection  1 mg/kg Subcutaneous QHS  . levothyroxine  75 mcg Oral QAC breakfast  . lisinopril  20 mg Oral Daily  . metoprolol succinate  25 mg Oral QHS  . senna  1 tablet Oral BID  . sodium chloride flush  3 mL Intravenous Q12H   Continuous Infusions: . sodium chloride 50 mL/hr at 08/22/15 0817    Assessment/Plan:  1. Acute encephalopathy, acute cystitis with hematuria. It on a urine culture to the previous urine. This was present on admission. Start IV Rocephin. IV fluid hydration. 2. Weakness- get physical therapy evaluation 3. Elevated troponin. The patient does not have chest pain or shortness of breath. Obtain an echocardiogram 4. Hypokalemia replace potassium 5. Essential hypertension continue usual medications 6. Hypothyroidism unspecified continue levothyroxine and check a TSH  Code Status:     Code Status Orders        Start     Ordered   08/22/15 0410  Full code   Continuous     08/22/15 0409    Code Status History    Date Active Date Inactive Code Status Order ID Comments User Context   This patient has a current code status but no historical code status.     Family Communication: Spoke with son on the phone Disposition Plan: This will be based on physical therapy evaluation. Patient lives alone  Antibiotics:  Rocephin  Time spent: 35 minutes  Melanie Tanner  [facility]  [facility]

## 2015-08-22 NOTE — Care Management Obs Status (Signed)
MEDICARE OBSERVATION STATUS NOTIFICATION   Patient Details  Name: Melanie Tanner MRN: [id number] Date of Birth: [birth year]   Medicare Observation Status Notification Given:  Yes (patient with altered mental status / no family present / letter left at bedside)    Caren Macadam, RN 08/22/2015, 11:48 AM

## 2015-08-22 NOTE — Plan of Care (Signed)
Problem: Education: Goal: Knowledge of [facility] Education information/materials will improve Outcome: Not Progressing Patient with confusion.  Repeated reorientation and education.  Patient not retaining information

## 2015-08-22 NOTE — ED Provider Notes (Signed)
[facility] Emergency Department Provider Note  ____________________________________________  Time seen: Approximately 2318 AM  I have reviewed the triage vital signs and the nursing notes.   HISTORY  Chief Complaint Weakness    HPI Melanie Tanner is a 82 y.o. female who comes into the hospital today feeling weak. The patient reports that when she got up and walked around she started to feel weak. The patient also reports she is unsure if it happened later in today. The patient lives at home alone. According to her son she walks around with a cane by herself. The patient's son does not think she ate much today and the patient does not remember how much she ate today. He reports that she forgets occasionally but is not usually too bad. The patient denies specific weaknesses feels weak all over. She was seen by EMS at 7 PM but then when she felt weak and hour later after drinking some insurance water she thought she should get checked out. The patient denies pain anywhere just reports that she feels weak. She's had no vomiting no diarrhea no shortness of breath no headache or dizziness. The patient is hard of hearing and does repeat answers frequently.   Past Medical History  Diagnosis Date  . Hypertension   . Thyroid disease   . Atrial fibrillation [facility])     Patient Active Problem List   Diagnosis Date Noted  . Dehydration 08/22/2015    Past Surgical History  Procedure Laterality Date  . Hip surgery      Current Outpatient Rx  Name  Route  Sig  Dispense  Refill  . amLODipine (NORVASC) 5 MG tablet   Oral   Take 1 tablet by mouth daily.      0   . digoxin (LANOXIN) 0.25 MG tablet   Oral   Take 1 tablet by mouth daily.      0   . levothyroxine (SYNTHROID, LEVOTHROID) 75 MCG tablet   Oral   Take 1 tablet by mouth daily.      0   . lisinopril (PRINIVIL,ZESTRIL) 20 MG tablet   Oral   Take 1 tablet by mouth daily.      0   . metoprolol  tartrate (LOPRESSOR) 25 MG tablet   Oral   Take 1 tablet by mouth daily.      0     Allergies Review of patient's allergies indicates no known allergies.  No family history on file.  Social History Social History  Substance Use Topics  . Smoking status: Never Smoker   . Smokeless tobacco: None  . Alcohol Use: No    Review of Systems Constitutional: No fever/chills Eyes: No visual changes. ENT: No sore throat. Cardiovascular: Denies chest pain. Respiratory: Denies shortness of breath. Gastrointestinal: No abdominal pain.  No nausea, no vomiting.  No diarrhea.  No constipation. Genitourinary: Negative for dysuria. Musculoskeletal: Negative for back pain. Skin: Negative for rash. Neurological: Generalized weakness  10-point ROS otherwise negative.  ____________________________________________   PHYSICAL EXAM:  VITAL SIGNS: ED Triage Vitals  Enc Vitals Group     BP 08/21/15 2153 156/68 mmHg     Pulse Rate 08/21/15 2153 54     Resp 08/21/15 2153 16     Temp 08/21/15 2153 97.9 F (36.6 C)     Temp Source 08/21/15 2153 Oral     SpO2 08/21/15 2153 99 %     Weight 08/21/15 2153 100 lb (45.36 kg)  Height 08/21/15 2153  (1.626 m)     Head Cir --      Peak Flow --      Pain Score 08/21/15 2154 0     Pain Loc --      Pain Edu? --      Excl. in GC? --     Constitutional: Alert and oriented. Well appearing and in no acute distress. Eyes: Conjunctivae are normal. PERRL. EOMI. Head: Atraumatic. Nose: No congestion/rhinnorhea. Mouth/Throat: Mucous membranes are moist.  Oropharynx non-erythematous. Cardiovascular: Normal rate, regular rhythm. With systolic murmur.  Good peripheral circulation. Respiratory: Normal respiratory effort.  No retractions. Lungs CTAB. Gastrointestinal: Soft and nontender. No distention. Positive bowel sounds Musculoskeletal: No lower extremity tenderness nor edema.  Neurologic:  Normal speech and language. No gross focal neurologic  deficits are appreciated. Cranial nerves II through XII are grossly intact Skin:  Skin is warm, dry and intact.  Psychiatric: Mood and affect are normal.  ____________________________________________   LABS (all labs ordered are listed, but only abnormal results are displayed)  Labs Reviewed  BASIC METABOLIC PANEL - Abnormal; Notable for the following:    Glucose, Bld 145 (*)    BUN 37 (*)    GFR calc non Af Amer 51 (*)    GFR calc Af Amer 60 (*)    All other components within normal limits  CBC - Abnormal; Notable for the following:    RBC 3.41 (*)    Hemoglobin 11.4 (*)    HCT 34.0 (*)    Platelets 90 (*)    All other components within normal limits  TROPONIN I - Abnormal; Notable for the following:    Troponin I 0.14 (*)    All other components within normal limits  URINALYSIS COMPLETEWITH MICROSCOPIC ([facility] ONLY)  DIGOXIN LEVEL  CBG MONITORING, ED   ____________________________________________  EKG  ED ECG REPORT I, Rebecka Apley, the attending physician, personally viewed and interpreted this ECG.   Date: 08/21/2015  EKG Time: 2208  Rate: 56  Rhythm: sinus bradycardia  Axis: Normal  Intervals:none  ST&T Change: None  ____________________________________________  RADIOLOGY  CT head: Nonspecific low attenuation area in the left medial inferior cerebellum, MRI suggested for further evaluation, diffuse cerebral atrophy and mild small vessel ischemic changes, no acute intracranial hemorrhages. ____________________________________________   PROCEDURES  Procedure(s) performed: None  Critical Care performed: No  ____________________________________________   INITIAL IMPRESSION / ASSESSMENT AND PLAN / ED COURSE  Pertinent labs & imaging results that were available during my care of the patient were reviewed by me and considered in my medical decision making (see chart for details).  This is a 82 year old female who comes into the hospital today with  some generalized weakness. I did perform a CBC and a BMP initially which showed some elevated BUN with a concern for mild dehydration but when I added on a troponin the patient's troponin returned elevated. The patient also had a CT with some low attenuated area in her inferior cerebellum with a concern for possible acute CVA. I will give the patient dose of aspirin as well as a liter of normal saline and I will admit the patient to the hospitalist service for further evaluation of her weakness. At this time the patient has no further concerns and she'll be admitted to the hospitalist service. ____________________________________________   FINAL CLINICAL IMPRESSION(S) / ED DIAGNOSES  Final diagnoses:  Weakness  Elevated troponin      Rebecka Apley, MD 08/22/15 0150

## 2015-08-22 NOTE — Progress Notes (Signed)
Patient with symptoms of OCD.  She used an entire roll of toilet paper rubbing her perineum until it was raw and bleeding.  When NT and RN attempted to take paper away she become very aggressive and combative hitting and kicking.  She is yelling that we won't let her clean herself.  Attempts to reorient and explain reason failed.

## 2015-08-22 NOTE — ED Notes (Signed)
Pt. Was able to ambulate with assistance to toilet, unable to get urine

## 2015-08-23 ENCOUNTER — Observation Stay: Admit: 2015-08-23 | Payer: Medicare Other

## 2015-08-23 LAB — BASIC METABOLIC PANEL
Anion gap: 5 (ref 5–15)
BUN: 18 mg/dL (ref 6–20)
CALCIUM: 9 mg/dL (ref 8.9–10.3)
CHLORIDE: 109 mmol/L (ref 101–111)
CO2: 28 mmol/L (ref 22–32)
CREATININE: 0.85 mg/dL (ref 0.44–1.00)
GFR calc non Af Amer: 58 mL/min — ABNORMAL LOW (ref >60)
Glucose, Bld: 101 mg/dL — ABNORMAL HIGH (ref 65–99)
Potassium: 3.6 mmol/L (ref 3.5–5.1)
Sodium: 142 mmol/L (ref 135–145)

## 2015-08-23 LAB — TSH: TSH: 6.146 u[IU]/mL — ABNORMAL HIGH (ref 0.350–4.500)

## 2015-08-23 MED ORDER — ENSURE ENLIVE PO LIQD
237.0000 mL | Freq: Three times a day (TID) | ORAL | Status: DC
  Administered 2015-08-23 – 2015-08-24 (×3): 237 mL via ORAL

## 2015-08-23 MED ORDER — QUETIAPINE FUMARATE 25 MG PO TABS
25.0000 mg | ORAL_TABLET | Freq: Every day | ORAL | Status: DC
  Administered 2015-08-23: 25 mg via ORAL
  Filled 2015-08-23: qty 1

## 2015-08-23 MED ORDER — ZIPRASIDONE MESYLATE 20 MG IM SOLR
10.0000 mg | Freq: Three times a day (TID) | INTRAMUSCULAR | Status: DC | PRN
  Administered 2015-08-23 – 2015-08-24 (×2): 10 mg via INTRAMUSCULAR
  Filled 2015-08-23 (×3): qty 20

## 2015-08-23 MED ORDER — ENOXAPARIN SODIUM 40 MG/0.4ML ~~LOC~~ SOLN
40.0000 mg | SUBCUTANEOUS | Status: DC
  Administered 2015-08-23: 20:00:00 40 mg via SUBCUTANEOUS
  Filled 2015-08-23: qty 0.4

## 2015-08-23 MED ORDER — AMLODIPINE BESYLATE 10 MG PO TABS
10.0000 mg | ORAL_TABLET | Freq: Every day | ORAL | Status: DC
  Administered 2015-08-23 – 2015-08-24 (×2): 10 mg via ORAL
  Filled 2015-08-23 (×2): qty 1

## 2015-08-23 NOTE — Progress Notes (Signed)
Initial clinical for SNF authorization process sent to [facility] at [phone].

## 2015-08-23 NOTE — Progress Notes (Signed)
Physical Therapy Treatment Patient Details Name: Melanie Tanner MRN: [id number] DOB: 27-Aug-1921 Today's Date: 08/23/2015    History of Present Illness Pt is here with aletered mental status, dehydration.      PT Comments    Pt is thoroughly confused during therapy session. She requires continual redirection for attention to task. Hand over hand and heavy verbal/tactile cues for participation with exercises. Deferred transfers and ambulation on this date due to pt being eager to eat dinner. Son present for entire session. Will continue to progress transfers and ambulation on next session as appropriate. Pt will benefit from skilled PT services to address deficits in strength, balance, and mobility in order to return to full function at home.    Follow Up Recommendations  SNF     Equipment Recommendations  Other (comment) (TBD at SNF)    Recommendations for Other Services       Precautions / Restrictions Precautions Precautions: Fall Restrictions Weight Bearing Restrictions: No    Mobility  Bed Mobility Overal bed mobility: Needs Assistance Bed Mobility: Supine to Sit     Supine to sit: Mod assist Sit to supine: Mod assist   General bed mobility comments: Pt needs heavy verbal and tactile cues for bed mobility. Difficulty following commands. Weakness noted  Transfers                 General transfer comment: Deferred  Ambulation/Gait             General Gait Details: Deferred   Stairs            Wheelchair Mobility    Modified Rankin (Stroke Patients Only)       Balance Overall balance assessment: Needs assistance Sitting-balance support: No upper extremity supported Sitting balance-Leahy Scale: Fair                              Cognition Arousal/Alertness: Awake/alert Behavior During Therapy: WFL for tasks assessed/performed Overall Cognitive Status: Impaired/Different from baseline Area of Impairment:  Orientation Orientation Level: Disoriented to;Place;Time;Situation             General Comments: Pt continually asks about her age, where she went to school, and what she did for work. Confuses son for deceased husband    Exercises General Exercises - Lower Extremity Ankle Circles/Pumps: Strengthening;Both;15 reps;Supine Long Arc Quad: Strengthening;Both;15 reps;Seated Heel Slides: Strengthening;Both;15 reps;Supine (Performed x 15 bilateral in sitting as well) Hip ABduction/ADduction: Strengthening;Both;15 reps;Seated Straight Leg Raises: Strengthening;Both;15 reps;Supine Hip Flexion/Marching: Strengthening;Both;15 reps;Seated Heel Raises: Strengthening;Both;15 reps;Seated    General Comments        Pertinent Vitals/Pain Pain Assessment: No/denies pain    Home Living                      Prior Function            PT Goals (current goals can now be found in the care plan section) Acute Rehab PT Goals Patient Stated Goal: pt wants to go home PT Goal Formulation: Patient unable to participate in goal setting Time For Goal Achievement: 09/05/15 Potential to Achieve Goals: Fair Progress towards PT goals: Progressing toward goals    Frequency  Min 2X/week    PT Plan Current plan remains appropriate    Co-evaluation             End of Session   Activity Tolerance: Other (comment) (Limited by confusion and command follow) Patient left:  in bed;with call bell/phone within reach;with bed alarm set;with family/visitor present     Time: 1650-1705 PT Time Calculation (min) (ACUTE ONLY): 15 min  Charges:  $Therapeutic Exercise: 8-22 mins                    G Codes:      Sharalyn Ink Zidan Helget PT, DPT   Sadonna Kotara 08/23/2015, 5:16 PM

## 2015-08-23 NOTE — Progress Notes (Signed)
Patient ID: TALLULAH HOSMAN, female   DOB: February 05, 1922, 82 y.o.   MRN: [id number] [facility] PROGRESS NOTE  HENLEY BLYTH WUJ:[id number] DOB: [birth year] DOA: 08/21/2015 PCP: No primary care provider on file.  HPI/Subjective: Patient states that she lives in [place]. She did not believe me that this is the hospital. She stated that she's 82 years old and she is in nursing school. The patient is difficult to focus. As per the nurse, she did not sleep well last night.  Objective: Filed Vitals:   08/23/15 0607 08/23/15 1046  BP: 171/91 157/58  Pulse: 73 63  Temp:    Resp: 18     Filed Weights   08/21/15 2153 08/22/15 0328  Weight: 45.36 kg (100 lb) 46.494 kg (102 lb 8 oz)    ROS: Review of Systems  Unable to perform ROS  secondary to acute delirium. Exam: Physical Exam  HENT:  Nose: No mucosal edema.  Mouth/Throat: No oropharyngeal exudate or posterior oropharyngeal edema.  Eyes: Conjunctivae and lids are normal. Pupils are equal, round, and reactive to light.  Neck: No JVD present. Carotid bruit is not present. No edema present. No thyroid mass and no thyromegaly present.  Cardiovascular: S1 normal and S2 normal.  Exam reveals no gallop.   No murmur heard. Pulses:      Dorsalis pedis pulses are 2+ on the right side, and 2+ on the left side.  Respiratory: No respiratory distress. She has no wheezes. She has no rhonchi. She has no rales.  GI: Soft. Bowel sounds are normal. There is no tenderness.  Musculoskeletal:       Right ankle: She exhibits no swelling.       Left ankle: She exhibits no swelling.  Lymphadenopathy:    She has no cervical adenopathy.  Neurological: She is alert.   Patient stood up on her own in front of me.  Skin: Skin is warm. No rash noted. Nails show no clubbing.  Psychiatric: Her mood appears anxious.      Data Reviewed: Basic Metabolic Panel:  Recent Labs Lab 08/21/15 2231 08/22/15 0418 08/23/15 0919  NA 141 144 142  K  3.5 3.4* 3.6  CL 104 107 109  CO2 GLUCOSE 145* 95 101*  BUN 37* 32* 18  CREATININE 0.94 0.91 0.85  CALCIUM 9.1 8.9 9.0   CBC:  Recent Labs Lab 08/21/15 2231 08/22/15 0418  WBC 5.5 5.2  HGB 11.4* 10.9*  HCT 34.0* 31.9*  MCV 99.6 100.0  PLT 90* 87*   Cardiac Enzymes:  Recent Labs Lab 08/21/15 2231 08/22/15 0418 08/22/15 1002 08/22/15 1551  TROPONINI 0.14* 0.14* 0.13* 0.10*     Studies: Ct Head Wo Contrast  08/21/2015  CLINICAL DATA:  Weakness today. EXAM: CT HEAD WITHOUT CONTRAST TECHNIQUE: Contiguous axial images were obtained from the base of the skull through the vertex without intravenous contrast. COMPARISON:  None. FINDINGS: Diffuse cerebral atrophy. Ventricular dilatation consistent with central atrophy. Low-attenuation changes in the deep white matter consistent with small vessel ischemia. In the left medial inferior cerebellum, there is a 19.5 mm area of low attenuation with some mass effect on the adjacent fourth ventricle. This is nonspecific and could represent infarct, mass, or abscess. MRI suggested for further evaluation. No acute intracranial hemorrhage. No abnormal extra-axial fluid collections. Gray-white matter junctions are distinct. Basal cisterns are not effaced. Calvarium appears intact. Mild mucosal thickening in the paranasal sinuses. Mastoid air cells are not opacified. Vascular calcifications. IMPRESSION: Nonspecific  low-attenuation area in the left medial inferior cerebellum. MRI suggested for further evaluation. Diffuse cerebral atrophy and small vessel ischemic changes. No acute intracranial hemorrhage. These results were called by telephone at the time of interpretation on 08/21/2015 at 11:51 pm to Dr. Lucrezia Europe , who verbally acknowledged these results. Electronically Signed   By: Burman Nieves M.D.   On: 08/21/2015 23:53    Scheduled Meds: . amLODipine  10 mg Oral Daily  . cefTRIAXone (ROCEPHIN)  IV  1 g Intravenous Q24H  .  enoxaparin (LOVENOX) injection  40 mg Subcutaneous Q24H  . levothyroxine  75 mcg Oral QAC breakfast  . lisinopril  20 mg Oral Daily  . metoprolol succinate  25 mg Oral QHS  . QUEtiapine  25 mg Oral QHS  . senna  1 tablet Oral BID  . sodium chloride flush  3 mL Intravenous Q12H    Assessment/Plan:  1. Acute delirium, acute cystitis with hematuria.  So far urine culture is negative. Likely we'll give another day of IV Rocephin and stop antibiotics. IM Geodon as needed for agitation. Seroquel at night to get her sleeping. Patient likely has an underlying dementia. Case discussed with son on the phone. I'm concerned about her living alone. Patient may end up needing long-term care or 24/7 care after rehabilitation finished.. 2. Weakness- physical therapy  Recommended rehabilitation 3. Elevated troponin. The patient does not have chest pain or shortness of breath.  Echocardiogram not read yet 4. Hypokalemia -  replace 5. Essential hypertension continue usual medications 6. Hypothyroidism unspecified continue levothyroxine.  TSH only slightly elevated. This could be with noncompliance.  Code Status:     Code Status Orders        Start     Ordered   08/22/15 0410  Full code   Continuous     08/22/15 0409    Code Status History    Date Active Date Inactive Code Status Order ID Comments User Context   This patient has a current code status but no historical code status.     Family Communication: Spoke with son on the phone Disposition Plan:  Physical therapy recommended rehabilitation but this has to be improved by the insurance company. Patient lives alone Dewayne Hatch is an unsafe discharge home alone  Antibiotics:  Rocephin  Time spent: 32 minutes  Alford Highland  [facility]

## 2015-08-23 NOTE — Care Management (Signed)
1st admission to [facility]. Admitted under observation status with the diagnosis of dehydration. Lives alone. Son is Tinsley Everman [phone]). Dr. Jonny Ruiz walker is listed as primary care physician. Uses a cane to aid in ambulation.  Physical therapy evaluation completed. Recommends skilled nursing facility. Dr. Renae Gloss spoke with Ms. Basham and [facility] at [facility] this morning. Ms. Kennington will need skilled nursing facility and possibly long term care.  Gwenette Greet RN MSN CCM Care Management [phone]

## 2015-08-23 NOTE — NC FL2 (Signed)
[place] MEDICAID FL2 LEVEL OF CARE SCREENING TOOL     IDENTIFICATION  Patient Name: Melanie Tanner Birthdate: 1921/10/04 Sex: female Admission Date (Current Location): 08/21/2015  [place] and [place] Number:  Chiropodist and Address:  [facility], [address], [place], [place] [address]      Provider Number: [id number]  Attending Physician Name and Address:  Alford Highland, MD  Relative Name and Phone Number:       Current Level of Care: Hospital Recommended Level of Care: Skilled Nursing Facility Prior Approval Number:    Date Approved/Denied:   PASRR Number: [id number]  Discharge Plan: SNF    Current Diagnoses: Patient Active Problem List   Diagnosis Date Noted  . Dehydration 08/22/2015    Orientation RESPIRATION BLADDER Height & Weight     Self  Normal Continent Weight: 102 lb 8 oz (46.494 kg) Height:   (162.6 cm)  BEHAVIORAL SYMPTOMS/MOOD NEUROLOGICAL BOWEL NUTRITION STATUS      Continent Diet (2 grams sodium, thin liquids)  AMBULATORY STATUS COMMUNICATION OF NEEDS Skin   Extensive Assist   Normal                       Personal Care Assistance Level of Assistance  Bathing, Feeding, Dressing Bathing Assistance: Limited assistance Feeding assistance: Limited assistance Dressing Assistance: Limited assistance     Functional Limitations Info  Sight, Hearing, Speech Sight Info: Adequate Hearing Info: Adequate Speech Info: Adequate    SPECIAL CARE FACTORS FREQUENCY  PT (By licensed PT)                    Contractures      Additional Factors Info  Code Status, Allergies Code Status Info: Full Code Allergies Info: No known allergies           Current Medications (08/23/2015):  This is the current hospital active medication list Current Facility-Administered Medications  Medication Dose Route Frequency Provider Last Rate Last Dose  . acetaminophen (TYLENOL) tablet 650 mg  650 mg Oral Q6H  PRN Ihor Austin, MD       Or  . acetaminophen (TYLENOL) suppository 650 mg  650 mg Rectal Q6H PRN Pavan Pyreddy, MD      . amLODipine (NORVASC) tablet 10 mg  10 mg Oral Daily Alford Highland, MD   10 mg at 08/23/15 1043  . cefTRIAXone (ROCEPHIN) 1 g in dextrose 5 % 50 mL IVPB  1 g Intravenous Q24H Alford Highland, MD   1 g at 08/23/15 0732  . enoxaparin (LOVENOX) injection 45 mg  1 mg/kg Subcutaneous QHS Ihor Austin, MD   45 mg at 08/22/15 0244  . levothyroxine (SYNTHROID, LEVOTHROID) tablet 75 mcg  75 mcg Oral QAC breakfast Ihor Austin, MD   75 mcg at 08/23/15 0732  . lisinopril (PRINIVIL,ZESTRIL) tablet 20 mg  20 mg Oral Daily Ihor Austin, MD   20 mg at 08/23/15 1043  . metoprolol succinate (TOPROL-XL) 24 hr tablet 25 mg  25 mg Oral QHS Alford Highland, MD   25 mg at 08/22/15 2144  . ondansetron (ZOFRAN) tablet 4 mg  4 mg Oral Q6H PRN Ihor Austin, MD       Or  . ondansetron (ZOFRAN) injection 4 mg  4 mg Intravenous Q6H PRN Pavan Pyreddy, MD      . QUEtiapine (SEROQUEL) tablet 25 mg  25 mg Oral QHS Alford Highland, MD      . [facility]) tablet  8.6 mg  1 tablet Oral BID Ihor Austin, MD   8.6 mg at 08/23/15 1043  . sodium chloride flush (NS) 0.9 % injection 3 mL  3 mL Intravenous Q12H Pavan Pyreddy, MD   3 mL at 08/22/15 2147  . ziprasidone (GEODON) injection 10 mg  10 mg Intramuscular Q8H PRN Alford Highland, MD         Discharge Medications: Please see discharge summary for a list of discharge medications.  Relevant Imaging Results:  Relevant Lab Results:   Additional Information SSN:  [id number]  Dede Query, LCSW

## 2015-08-23 NOTE — Clinical Social Work Placement (Signed)
   CLINICAL SOCIAL WORK PLACEMENT  NOTE  Date:  08/23/2015  Patient Details  Name: Melanie Tanner MRN: [id number] Date of Birth: Feb 12, 1922  Clinical Social Work is seeking post-discharge placement for this patient at the Skilled  Nursing Facility level of care (*CSW will initial, date and re-position this form in  chart as items are completed):  Yes   Patient/family provided with [facility] Clinical Social Work Department's list of facilities offering this level of care within the geographic area requested by the patient (or if unable, by the patient's family).  Yes   Patient/family informed of their freedom to choose among providers that offer the needed level of care, that participate in Medicare, Medicaid or managed care program needed by the patient, have an available bed and are willing to accept the patient.  Yes   Patient/family informed of [facility]'s ownership interest in [facility] and [facility], as well as of the fact that they are under no obligation to receive care at these facilities.  PASRR submitted to EDS on       PASRR number received on       Existing PASRR number confirmed on 08/23/15     FL2 transmitted to all facilities in geographic area requested by pt/family on 08/23/15     FL2 transmitted to all facilities within larger geographic area on       Patient informed that his/her managed care company has contracts with or will negotiate with certain facilities, including the following:            Patient/family informed of bed offers received.  Patient chooses bed at       Physician recommends and patient chooses bed at      Patient to be transferred to   on  .  Patient to be transferred to facility by       Patient family notified on   of transfer.  Name of family member notified:        PHYSICIAN       Additional Comment:    _______________________________________________ Dede Query, LCSW 08/23/2015, 12:18 PM

## 2015-08-23 NOTE — Clinical Social Work Note (Signed)
Clinical Social Work Assessment  Patient Details  Name: Melanie Tanner MRN: [id number] Date of Birth: [birth year]  Date of referral:  08/23/15               Reason for consult:  Facility Placement                Permission sought to share information with:  Family Supports Permission granted to share information::  Yes, Verbal Permission Granted  Name::     Carrissa Taitano, [phone]   Housing/Transportation Living arrangements for the past 2 months:  Single Family Home Source of Information:  Adult Children Patient Interpreter Needed:  None Criminal Activity/Legal Involvement Pertinent to Current Situation/Hospitalization:  No - Comment as needed Significant Relationships:  Adult Children Lives with:  Self Do you feel safe going back to the place where you live?  Yes (Family is aware that pt will need to have 24 supervision to return safely home) Need for family participation in patient care:  Yes (Comment)  Care giving concerns:  Pt lives alone and it is questionable if pt is able to return home safely.    Social Worker assessment / plan:  CSW spoke with pt's son to address consult. CSW introduced herself and explained role of social work. CSW also explained the process of discharging to SNF with [facility].  PT is recommending STR at SNF. CSW also shared that MD feels that pt would be a LTC patient after STR. Pt lives alone and son checks on her 3 times a week and brings her meals. Pt's son is agreeable to STR. Bed search initiated. Pt's inquired about returning home with caregivers. CSW will provide a list of Private Duty Care Services and Meals on Wheels. CSW will follow up with bed offers. CSW will continue to follow.   Employment status:  Retired Database administrator PT Recommendations:  Skilled Nursing Facility Information / Referral to community resources:  Skilled Nursing Facility  Patient/Family's Response to care:  Pt's son was Adult nurse of CSW  support.   Patient/Family's Understanding of and Emotional Response to Diagnosis, Current Treatment, and Prognosis:  Pt's on understands that needs rehab. However, after rehab, pt's son will have to address LTC plans.  Emotional Assessment Appearance:  Other (Comment Required Attitude/Demeanor/Rapport:  Other Affect (typically observed):  Other Orientation:  Fluctuating Orientation (Suspected and/or reported Sundowners) Alcohol / Substance use:  Never Used Psych involvement (Current and /or in the community):  No (Comment)  Discharge Needs  Concerns to be addressed:  Adjustment to Illness Readmission within the last 30 days:  Yes Current discharge risk:  Chronically ill Barriers to Discharge:  Continued Medical Work up   [employer], LCSW 08/23/2015, 12:07 PM

## 2015-08-23 NOTE — Clinical Social Work Note (Signed)
CSW spoke with pt's on to provide bed offers. He chose [facility]	[place]. CSW updated facility. CSW will start [facility] auth. CSW will continue to follow.   Dede Query, MSW, LCSW  Clinical Social Worker [phone]

## 2015-08-23 NOTE — Progress Notes (Signed)
Initial Nutrition Assessment   INTERVENTION:   Meals and Snacks: Cater to patient preferences Medical Food Supplement Therapy: Recommend Ensure Enlive po TID, each supplement provides 350 kcal and 20 grams of protein   NUTRITION DIAGNOSIS:   Inadequate oral intake related to acute illness as evidenced by meal completion < 50%.  GOAL:   Patient will meet greater than or equal to 90% of their needs  MONITOR:    (Energy Intake, Electrolyte and renal Profile, Anthropometrics, Digestive System)  REASON FOR ASSESSMENT:   Consult Poor PO  ASSESSMENT:   Pt admitted from home with Acute delirium and acute cystitis with hematuria.  Past Medical History  Diagnosis Date  . Hypertension   . Thyroid disease   . Atrial fibrillation (HCC)      Diet Order:  Diet 2 gram sodium Room service appropriate?: Yes; Fluid consistency:: Thin   Current Nutrition: Pt resting on visit this afternoon, confused this am. Pt tray at bedside, bites and sips at best eaten. Recorded po intake <50% of meals.   Food/Nutrition-Related History: Unable to assess, no family present   Scheduled Medications:  . amLODipine  10 mg Oral Daily  . cefTRIAXone (ROCEPHIN)  IV  1 g Intravenous Q24H  . enoxaparin (LOVENOX) injection  40 mg Subcutaneous Q24H  . levothyroxine  75 mcg Oral QAC breakfast  . lisinopril  20 mg Oral Daily  . metoprolol succinate  25 mg Oral QHS  . QUEtiapine  25 mg Oral QHS  . senna  1 tablet Oral BID  . sodium chloride flush  3 mL Intravenous Q12H     Electrolyte/Renal Profile and Glucose Profile:   Recent Labs Lab 08/21/15 2231 08/22/15 0418 08/23/15 0919  NA 141 144 142  K 3.5 3.4* 3.6  CL 104 107 109  CO2 BUN 37* 32* 18  CREATININE 0.94 0.91 0.85  CALCIUM 9.1 8.9 9.0  GLUCOSE 145* 95 101*   Protein Profile: No results for input(s): ALBUMIN in the last 168 hours.  Gastrointestinal Profile: Last BM:  08/23/2015   Nutrition-Focused Physical Exam  Findings:  Unable to complete Nutrition-Focused physical exam at this time.    Weight Change: No trend in CHL, per MST no decrease in weight   Height:   Ht Readings from Last 1 Encounters:  08/22/15  (1.626 m)    Weight:   Wt Readings from Last 1 Encounters:  08/22/15 102 lb 8 oz (46.494 kg)    Ideal Body Weight:   54.6kg  BMI:  Body mass index is 17.59 kg/(m^2).  Estimated Nutritional Needs:   Kcal:  BEE: 946kcals, TEE: (IF 1.1-1.3)(AF 1.3) 1352-1598kcals, using IBW of 54.6kg  Protein:  55-65g protein (1.0-1.2g/kg)  Fluid:  1365-163mL of fluid (25-13mL/kg)  EDUCATION NEEDS:   No education needs identified at this time   HIGH Care Level  Leda Quail, RD, LDN Pager [phone] Weekend/On-Call Pager [phone]

## 2015-08-23 NOTE — Clinical Social Work Placement (Signed)
   CLINICAL SOCIAL WORK PLACEMENT  NOTE  Date:  08/23/2015  Patient Details  Name: Melanie Tanner MRN: [id number] Date of Birth: 06/15/1922  Clinical Social Work is seeking post-discharge placement for this patient at the Skilled  Nursing Facility level of care (*CSW will initial, date and re-position this form in  chart as items are completed):  Yes   Patient/family provided with [place] Clinical Social Work Department's list of facilities offering this level of care within the geographic area requested by the patient (or if unable, by the patient's family).  Yes   Patient/family informed of their freedom to choose among providers that offer the needed level of care, that participate in Medicare, Medicaid or managed care program needed by the patient, have an available bed and are willing to accept the patient.  Yes   Patient/family informed of [facility]'s ownership interest in [facility] and [facility], as well as of the fact that they are under no obligation to receive care at these facilities.  PASRR submitted to EDS on       PASRR number received on       Existing PASRR number confirmed on 08/23/15     FL2 transmitted to all facilities in geographic area requested by pt/family on 08/23/15     FL2 transmitted to all facilities within larger geographic area on       Patient informed that his/her managed care company has contracts with or will negotiate with certain facilities, including the following:        Yes   Patient/family informed of bed offers received.  Patient chooses bed at [facility]     Physician recommends and patient chooses bed at  [facility])    Patient to be transferred to   on  .  Patient to be transferred to facility by       Patient family notified on   of transfer.  Name of family member notified:        PHYSICIAN       Additional Comment:    _______________________________________________ Dede Query, LCSW 08/23/2015, 1:03  PM

## 2015-08-24 ENCOUNTER — Encounter
Admission: RE | Admit: 2015-08-24 | Discharge: 2015-08-24 | Disposition: A | Discharge status: Home / Self Care | Payer: Medicare Other | Source: Ambulatory Visit | Attending: Internal Medicine | Admitting: Internal Medicine

## 2015-08-24 ENCOUNTER — Observation Stay: Admit: 2015-08-24 | Payer: Medicare Other

## 2015-08-24 DIAGNOSIS — N390 Urinary tract infection, site not specified: Secondary | ICD-10-CM | POA: Insufficient documentation

## 2015-08-24 DIAGNOSIS — D510 Vitamin B12 deficiency anemia due to intrinsic factor deficiency: Secondary | ICD-10-CM | POA: Insufficient documentation

## 2015-08-24 LAB — URINE CULTURE: Special Requests: NORMAL

## 2015-08-24 MED ORDER — QUETIAPINE FUMARATE 25 MG PO TABS: 25.0000 mg | ORAL_TABLET | Freq: Every day | ORAL | Status: DC

## 2015-08-24 MED ORDER — METOPROLOL SUCCINATE ER 25 MG PO TB24: 25.0000 mg | ORAL_TABLET | Freq: Every day | ORAL | Status: DC

## 2015-08-24 MED ORDER — ENSURE ENLIVE PO LIQD: 237.0000 mL | Freq: Three times a day (TID) | ORAL | Status: DC

## 2015-08-24 NOTE — Progress Notes (Signed)
Received SNF auth from [place] [phone], code [id number].  Auth viable to 08/25/15 only, with next review date being 08/26/15.

## 2015-08-24 NOTE — Clinical Social Work Placement (Signed)
   CLINICAL SOCIAL WORK PLACEMENT  NOTE  Date:  08/24/2015  Patient Details  Name: Melanie Tanner MRN: [id number] Date of Birth: 19-Jul-1921  Clinical Social Work is seeking post-discharge placement for this patient at the Skilled  Nursing Facility level of care (*CSW will initial, date and re-position this form in  chart as items are completed):  Yes   Patient/family provided with [place] Clinical Social Work Department's list of facilities offering this level of care within the geographic area requested by the patient (or if unable, by the patient's family).  Yes   Patient/family informed of their freedom to choose among providers that offer the needed level of care, that participate in Medicare, Medicaid or managed care program needed by the patient, have an available bed and are willing to accept the patient.  Yes   Patient/family informed of [facility]'s ownership interest in [facility] and [facility], as well as of the fact that they are under no obligation to receive care at these facilities.  PASRR submitted to EDS on       PASRR number received on       Existing PASRR number confirmed on 08/23/15     FL2 transmitted to all facilities in geographic area requested by pt/family on 08/23/15     FL2 transmitted to all facilities within larger geographic area on       Patient informed that his/her managed care company has contracts with or will negotiate with certain facilities, including the following:        Yes   Patient/family informed of bed offers received.  Patient chooses bed at [facility]     Physician recommends and patient chooses bed at  [facility])    Patient to be transferred to [facility] on 08/24/15.  Patient to be transferred to facility by [facility]     Patient family notified on 08/24/15 of transfer.  Name of family member notified:  Stevenson Clinch, son     PHYSICIAN       Additional Comment:     _______________________________________________ Dede Query, LCSW 08/24/2015, 11:21 AM

## 2015-08-24 NOTE — Clinical Social Work Note (Signed)
Pt is ready for discharge today to [facility]. Blue Medicare Berkley Harvey has been obtained. Pt's son is aware and agreeable to discharge plan. Facility has received discharge information. RN to call report. [facility] will provide transportation. CSW is signing off as no further needs identified.   Dede Query, MSW, LCSW  Clinical Social Worker  [phone]

## 2015-08-24 NOTE — Discharge Summary (Signed)
[facility] - [place] [facility]   PATIENT NAME: Melanie Tanner    MR#:  [id number]  DATE OF BIRTH:  1922-01-08  DATE OF ADMISSION:  08/21/2015 ADMITTING PHYSICIAN: Ihor Austin, MD  DATE OF DISCHARGE: 08/24/2015  PRIMARY CARE PHYSICIAN: No primary care provider on file.    ADMISSION DIAGNOSIS:  Weakness [R53.1] Elevated troponin [R79.89]  DISCHARGE DIAGNOSIS:  Principal Problem:   Dehydration   SECONDARY DIAGNOSIS:   Past Medical History  Diagnosis Date  . Hypertension   . Thyroid disease   . Atrial fibrillation (HCC)     HOSPITAL COURSE:   1.  Acute delirium. Acute cystitis with hematuria. Urine culture is negative to date. Patient received 3 days of IV Rocephin. No need for further antibiotics. Continue Seroquel at night to keep her sleeping throughout the night. Patient likely has underlying dementia. I am concerned about this patient's safety at home. Patient will go out to rehabilitation today. Son will have to decide on whether he the patient needs long-term placement or 24/7 care at home upon discharge from the rehabilitation. 2. Weakness physical therapy recommended rehabilitation 3. Elevated troponin without chest pain or shortness of breath. This is likely demand ischemia from UTI 4. Hypokalemia this was replaced. 5. Essential hypertension continue usual medications 6. Hypothyroidism unspecified continue levothyroxine. TSH slightly elevated and this could be secondary to noncompliance with medications continue same dose of levothyroxine and recheck a TSH in 6 weeks 7. Fungus on the toenails- will need podiatry to cut those toenails as outpatient  DISCHARGE CONDITIONS:   Fair  CONSULTS OBTAINED:  None  DRUG ALLERGIES:  No Known Allergies  DISCHARGE MEDICATIONS:   Current Discharge Medication List    START taking these medications   Details  feeding supplement, ENSURE ENLIVE, (ENSURE ENLIVE) LIQD Take 237 mLs by mouth 3  (three) times daily with meals. Qty: 237 mL, Refills: 12    metoprolol succinate (TOPROL-XL) 25 MG 24 hr tablet Take 1 tablet (25 mg total) by mouth at bedtime. Qty: 30 tablet, Refills: 0    QUEtiapine (SEROQUEL) 25 MG tablet Take 1 tablet (25 mg total) by mouth at bedtime. Qty: 30 tablet, Refills: 0      CONTINUE these medications which have NOT CHANGED   Details  amLODipine (NORVASC) 5 MG tablet Take 1 tablet by mouth daily. Refills: 0    levothyroxine (SYNTHROID, LEVOTHROID) 75 MCG tablet Take 1 tablet by mouth daily. Refills: 0    lisinopril (PRINIVIL,ZESTRIL) 20 MG tablet Take 1 tablet by mouth daily. Refills: 0      STOP taking these medications     digoxin (LANOXIN) 0.25 MG tablet      metoprolol tartrate (LOPRESSOR) 25 MG tablet          DISCHARGE INSTRUCTIONS:   Follow-up with Dr. at rehabilitation one day  If you experience worsening of your admission symptoms, develop shortness of breath, life threatening emergency, suicidal or homicidal thoughts you must seek medical attention immediately by calling 911 or calling your MD immediately  if symptoms less severe.  You Must read complete instructions/literature along with all the possible adverse reactions/side effects for all the Medicines you take and that have been prescribed to you. Take any new Medicines after you have completely understood and accept all the possible adverse reactions/side effects.   Please note  You were cared for by a hospitalist during your hospital stay. If you have any questions about your discharge medications or the care you received  while you were in the hospital after you are discharged, you can call the unit and asked to speak with the hospitalist on call if the hospitalist that took care of you is not available. Once you are discharged, your primary care physician will handle any further medical issues. Please note that NO REFILLS for any discharge medications will be authorized once  you are discharged, as it is imperative that you return to your primary care physician (or establish a relationship with a primary care physician if you do not have one) for your aftercare needs so that they can reassess your need for medications and monitor your lab values.    Today   CHIEF COMPLAINT:   Chief Complaint  Patient presents with  . Weakness    Pt. here via EMS from home.  Pt. states feeling weak today.    HISTORY OF PRESENT ILLNESS:  Melanie Tanner  is a 82 y.o. female presented to the hospital with weakness and found to have a possibility of her urinary tract infection   VITAL SIGNS:  Blood pressure 145/70, pulse 66, temperature 98.6 F (37 C), temperature source Oral, resp. rate 20, height 5\' 4"  (1.626 m), weight 46.494 kg (102 lb 8 oz), SpO2 99 %.    PHYSICAL EXAMINATION:  GENERAL:  82 y.o.-year-old patient lying in the bed with no acute distress.  EYES: Pupils equal, round, reactive to light and accommodation. No scleral icterus. Extraocular muscles intact.  HEENT: Head atraumatic, normocephalic. Oropharynx and nasopharynx clear.  NECK:  Supple, no jugular venous distention. No thyroid enlargement, no tenderness.  LUNGS: Normal breath sounds bilaterally, no wheezing, rales,rhonchi or crepitation. No use of accessory muscles of respiration.  CARDIOVASCULAR: S1, S2 normal. No murmurs, rubs, or gallops.  ABDOMEN: Soft, non-tender, non-distended. Bowel sounds present. No organomegaly or mass.  EXTREMITIES: No pedal edema, cyanosis, or clubbing.  NEUROLOGIC: Cranial nerves II through XII are intact. Sensation intact. Gait not checked.  PSYCHIATRIC: The patient is alert and answers some yes or no questions.Marland Kitchen  SKIN: No obvious rash, lesion, or ulcer. Fungus toenails  DATA REVIEW:   CBC  Recent Labs Lab 08/22/15 0418  WBC 5.2  HGB 10.9*  HCT 31.9*  PLT 87*    Chemistries   Recent Labs Lab 08/23/15 0919  NA 142  K 3.6  CL 109  CO2 28  GLUCOSE 101*   BUN 18  CREATININE 0.85  CALCIUM 9.0    Cardiac Enzymes  Recent Labs Lab 08/22/15 1551  TROPONINI 0.10*    Microbiology Results  Results for orders placed or performed during the hospital encounter of 08/21/15  Urine culture     Status: None (Preliminary result)   Collection Time: 08/22/15  5:39 AM  Result Value Ref Range Status   Specimen Description URINE, CLEAN CATCH  Final   Special Requests Normal  Final   Culture NO GROWTH < 24 HOURS  Final   Report Status PENDING  Incomplete    Management plans discussed with the patient, family and they are in agreement.  CODE STATUS:     Code Status Orders        Start     Ordered   08/22/15 0410  Full code   Continuous     08/22/15 0409    Code Status History    Date Active Date Inactive Code Status Order ID Comments User Context   This patient has a current code status but no historical code status.      TOTAL  TIME TAKING CARE OF THIS PATIENT: 35 minutes.    Alford Highland M.D on 08/24/2015 at 10:13 AM  Between 7am to 6pm - Pager - [phone]  After 6pm go to [email or web address] - password [email or web address] [facility]  [facility]  Office  [phone]  CC: Primary care physician; No primary care provider on file.

## 2015-08-26 DIAGNOSIS — N390 Urinary tract infection, site not specified: Secondary | ICD-10-CM | POA: Diagnosis not present

## 2015-08-26 DIAGNOSIS — D510 Vitamin B12 deficiency anemia due to intrinsic factor deficiency: Secondary | ICD-10-CM | POA: Diagnosis present

## 2015-08-26 LAB — URINALYSIS COMPLETE WITH MICROSCOPIC (ARMC ONLY)
BILIRUBIN URINE: NEGATIVE
Bacteria, UA: NONE SEEN
GLUCOSE, UA: NEGATIVE mg/dL
Ketones, ur: NEGATIVE mg/dL
NITRITE: NEGATIVE
PH: 5 (ref 5.0–8.0)
Protein, ur: NEGATIVE mg/dL
Specific Gravity, Urine: 1.02 (ref 1.005–1.030)

## 2015-08-26 LAB — VITAMIN B12: VITAMIN B 12: 1369 pg/mL — AB (ref 180–914)

## 2015-08-30 LAB — URINE CULTURE: Culture: 40000

## 2015-09-01 ENCOUNTER — Encounter
Admission: RE | Admit: 2015-09-01 | Discharge: 2015-09-01 | Disposition: A | Discharge status: Home / Self Care | Payer: Medicare Other | Source: Ambulatory Visit | Attending: Internal Medicine | Admitting: Internal Medicine

## 2015-09-01 DIAGNOSIS — N189 Chronic kidney disease, unspecified: Secondary | ICD-10-CM | POA: Insufficient documentation

## 2015-09-01 DIAGNOSIS — D649 Anemia, unspecified: Secondary | ICD-10-CM | POA: Insufficient documentation

## 2015-09-02 DIAGNOSIS — N189 Chronic kidney disease, unspecified: Secondary | ICD-10-CM | POA: Diagnosis present

## 2015-09-02 DIAGNOSIS — D649 Anemia, unspecified: Secondary | ICD-10-CM | POA: Diagnosis present

## 2015-09-02 LAB — CBC WITH DIFFERENTIAL/PLATELET
BASOS PCT: 2 %
Basophils Absolute: 0.1 10*3/uL (ref 0–0.1)
EOS ABS: 0.2 10*3/uL (ref 0–0.7)
EOS PCT: 4 %
HCT: 32.5 % — ABNORMAL LOW (ref 35.0–47.0)
Hemoglobin: 10.9 g/dL — ABNORMAL LOW (ref 12.0–16.0)
Lymphocytes Relative: 24 %
Lymphs Abs: 1.2 10*3/uL (ref 1.0–3.6)
MCH: 33.5 pg (ref 26.0–34.0)
MCHC: 33.6 g/dL (ref 32.0–36.0)
MCV: 99.7 fL (ref 80.0–100.0)
MONO ABS: 0.5 10*3/uL (ref 0.2–0.9)
MONOS PCT: 10 %
NEUTROS PCT: 60 %
Neutro Abs: 3 10*3/uL (ref 1.4–6.5)
PLATELETS: 144 10*3/uL — AB (ref 150–440)
RBC: 3.26 MIL/uL — ABNORMAL LOW (ref 3.80–5.20)
RDW: 13 % (ref 11.5–14.5)
WBC: 5 10*3/uL (ref 3.6–11.0)

## 2015-09-02 LAB — COMPREHENSIVE METABOLIC PANEL
ALBUMIN: 3.6 g/dL (ref 3.5–5.0)
ALT: 16 U/L (ref 14–54)
ANION GAP: 6 (ref 5–15)
AST: 20 U/L (ref 15–41)
Alkaline Phosphatase: 47 U/L (ref 38–126)
BUN: 32 mg/dL — ABNORMAL HIGH (ref 6–20)
CO2: 30 mmol/L (ref 22–32)
Calcium: 9.4 mg/dL (ref 8.9–10.3)
Chloride: 104 mmol/L (ref 101–111)
Creatinine, Ser: 1.52 mg/dL — ABNORMAL HIGH (ref 0.44–1.00)
GFR calc Af Amer: 33 mL/min — ABNORMAL LOW (ref >60)
GFR calc non Af Amer: 29 mL/min — ABNORMAL LOW (ref >60)
GLUCOSE: 135 mg/dL — AB (ref 65–99)
POTASSIUM: 4.1 mmol/L (ref 3.5–5.1)
SODIUM: 140 mmol/L (ref 135–145)
TOTAL PROTEIN: 6.2 g/dL — AB (ref 6.5–8.1)
Total Bilirubin: 0.7 mg/dL (ref 0.3–1.2)

## 2015-09-03 LAB — VITAMIN D 25 HYDROXY (VIT D DEFICIENCY, FRACTURES): Vit D, 25-Hydroxy: 48.2 ng/mL (ref 30.0–100.0)

## 2015-11-21 ENCOUNTER — Encounter: Payer: Self-pay | Admitting: *Deleted

## 2015-11-21 ENCOUNTER — Inpatient Hospital Stay
Admission: EM | Admit: 2015-11-21 | Discharge: 2015-11-23 | DRG: 536 | Disposition: A | Discharge status: Skilled Nursing Facility | Payer: Medicare Other | Attending: Internal Medicine | Admitting: Internal Medicine

## 2015-11-21 ENCOUNTER — Emergency Department: Payer: Medicare Other

## 2015-11-21 DIAGNOSIS — I10 Essential (primary) hypertension: Secondary | ICD-10-CM | POA: Diagnosis present

## 2015-11-21 DIAGNOSIS — R778 Other specified abnormalities of plasma proteins: Secondary | ICD-10-CM

## 2015-11-21 DIAGNOSIS — W19XXXA Unspecified fall, initial encounter: Secondary | ICD-10-CM | POA: Diagnosis present

## 2015-11-21 DIAGNOSIS — Z8781 Personal history of (healed) traumatic fracture: Secondary | ICD-10-CM | POA: Diagnosis not present

## 2015-11-21 DIAGNOSIS — E039 Hypothyroidism, unspecified: Secondary | ICD-10-CM | POA: Diagnosis present

## 2015-11-21 DIAGNOSIS — Z66 Do not resuscitate: Secondary | ICD-10-CM | POA: Diagnosis not present

## 2015-11-21 DIAGNOSIS — F0390 Unspecified dementia without behavioral disturbance: Secondary | ICD-10-CM | POA: Diagnosis present

## 2015-11-21 DIAGNOSIS — S32592A Other specified fracture of left pubis, initial encounter for closed fracture: Secondary | ICD-10-CM | POA: Diagnosis present

## 2015-11-21 DIAGNOSIS — S32591A Other specified fracture of right pubis, initial encounter for closed fracture: Secondary | ICD-10-CM | POA: Diagnosis present

## 2015-11-21 DIAGNOSIS — Z79899 Other long term (current) drug therapy: Secondary | ICD-10-CM | POA: Diagnosis not present

## 2015-11-21 DIAGNOSIS — S32512A Fracture of superior rim of left pubis, initial encounter for closed fracture: Secondary | ICD-10-CM

## 2015-11-21 DIAGNOSIS — Y92129 Unspecified place in nursing home as the place of occurrence of the external cause: Secondary | ICD-10-CM | POA: Diagnosis not present

## 2015-11-21 DIAGNOSIS — S3282XA Multiple fractures of pelvis without disruption of pelvic ring, initial encounter for closed fracture: Secondary | ICD-10-CM | POA: Diagnosis present

## 2015-11-21 DIAGNOSIS — R54 Age-related physical debility: Secondary | ICD-10-CM | POA: Diagnosis present

## 2015-11-21 DIAGNOSIS — Z681 Body mass index (BMI) 19 or less, adult: Secondary | ICD-10-CM | POA: Diagnosis not present

## 2015-11-21 DIAGNOSIS — S32511A Fracture of superior rim of right pubis, initial encounter for closed fracture: Secondary | ICD-10-CM

## 2015-11-21 DIAGNOSIS — I482 Chronic atrial fibrillation: Secondary | ICD-10-CM | POA: Diagnosis present

## 2015-11-21 DIAGNOSIS — R7989 Other specified abnormal findings of blood chemistry: Secondary | ICD-10-CM

## 2015-11-21 DIAGNOSIS — E860 Dehydration: Secondary | ICD-10-CM | POA: Diagnosis present

## 2015-11-21 DIAGNOSIS — R52 Pain, unspecified: Secondary | ICD-10-CM

## 2015-11-21 DIAGNOSIS — IMO0002 Reserved for concepts with insufficient information to code with codable children: Secondary | ICD-10-CM

## 2015-11-21 DIAGNOSIS — E46 Unspecified protein-calorie malnutrition: Secondary | ICD-10-CM | POA: Diagnosis present

## 2015-11-21 DIAGNOSIS — Z8249 Family history of ischemic heart disease and other diseases of the circulatory system: Secondary | ICD-10-CM | POA: Diagnosis not present

## 2015-11-21 LAB — CBC WITH DIFFERENTIAL/PLATELET
BASOS ABS: 0 10*3/uL (ref 0–0.1)
Basophils Relative: 0 %
Eosinophils Absolute: 0 10*3/uL (ref 0–0.7)
Eosinophils Relative: 0 %
HCT: 35.4 % (ref 35.0–47.0)
HEMOGLOBIN: 11.8 g/dL — AB (ref 12.0–16.0)
LYMPHS ABS: 0.3 10*3/uL — AB (ref 1.0–3.6)
MCH: 32 pg (ref 26.0–34.0)
MCHC: 33.4 g/dL (ref 32.0–36.0)
MCV: 95.7 fL (ref 80.0–100.0)
Monocytes Absolute: 1.2 10*3/uL — ABNORMAL HIGH (ref 0.2–0.9)
Monocytes Relative: 7 %
Neutro Abs: 14.3 10*3/uL — ABNORMAL HIGH (ref 1.4–6.5)
PLATELETS: 112 10*3/uL — AB (ref 150–440)
RBC: 3.7 MIL/uL — AB (ref 3.80–5.20)
RDW: 13.2 % (ref 11.5–14.5)
WBC: 15.8 10*3/uL — AB (ref 3.6–11.0)

## 2015-11-21 LAB — MAGNESIUM: MAGNESIUM: 1.9 mg/dL (ref 1.7–2.4)

## 2015-11-21 LAB — COMPREHENSIVE METABOLIC PANEL
ALK PHOS: 79 U/L (ref 38–126)
ALT: 23 U/L (ref 14–54)
AST: 32 U/L (ref 15–41)
Albumin: 4.4 g/dL (ref 3.5–5.0)
Anion gap: 8 (ref 5–15)
BUN: 33 mg/dL — AB (ref 6–20)
CALCIUM: 9.8 mg/dL (ref 8.9–10.3)
CHLORIDE: 98 mmol/L — AB (ref 101–111)
CO2: 30 mmol/L (ref 22–32)
CREATININE: 0.96 mg/dL (ref 0.44–1.00)
GFR, EST AFRICAN AMERICAN: 58 mL/min — AB (ref >60)
GFR, EST NON AFRICAN AMERICAN: 50 mL/min — AB (ref >60)
Glucose, Bld: 130 mg/dL — ABNORMAL HIGH (ref 65–99)
Potassium: 3.8 mmol/L (ref 3.5–5.1)
SODIUM: 136 mmol/L (ref 135–145)
Total Bilirubin: 1.2 mg/dL (ref 0.3–1.2)
Total Protein: 7.1 g/dL (ref 6.5–8.1)

## 2015-11-21 LAB — URINALYSIS COMPLETE WITH MICROSCOPIC (ARMC ONLY)
BACTERIA UA: NONE SEEN
Bilirubin Urine: NEGATIVE
GLUCOSE, UA: NEGATIVE mg/dL
Hgb urine dipstick: NEGATIVE
Leukocytes, UA: NEGATIVE
NITRITE: NEGATIVE
PROTEIN: NEGATIVE mg/dL
Specific Gravity, Urine: 1.016 (ref 1.005–1.030)
pH: 5 (ref 5.0–8.0)

## 2015-11-21 LAB — GLUCOSE, CAPILLARY
Glucose-Capillary: 122 mg/dL — ABNORMAL HIGH (ref 65–99)
Glucose-Capillary: 122 mg/dL — ABNORMAL HIGH (ref 65–99)
Glucose-Capillary: 104 mg/dL — ABNORMAL HIGH (ref 65–99)

## 2015-11-21 LAB — TROPONIN I: TROPONIN I: 0.08 ng/mL — AB (ref <0.031)

## 2015-11-21 LAB — MRSA PCR SCREENING: MRSA by PCR: NEGATIVE

## 2015-11-21 LAB — PROTIME-INR
INR: 1.07
PROTHROMBIN TIME: 14.1 s (ref 11.4–15.0)

## 2015-11-21 LAB — HEMOGLOBIN A1C: Hgb A1c MFr Bld: 4.9 % (ref 4.0–6.0)

## 2015-11-21 LAB — TSH: TSH: 0.909 u[IU]/mL (ref 0.350–4.500)

## 2015-11-21 MED ORDER — SODIUM CHLORIDE 0.9 % IV SOLN
INTRAVENOUS | Status: DC
  Administered 2015-11-21: 09:00:00 via INTRAVENOUS

## 2015-11-21 MED ORDER — MIRTAZAPINE 15 MG PO TABS
7.5000 mg | ORAL_TABLET | Freq: Every day | ORAL | Status: DC
  Administered 2015-11-22: 7.5 mg via ORAL
  Filled 2015-11-21 (×2): qty 1

## 2015-11-21 MED ORDER — MORPHINE SULFATE (PF) 2 MG/ML IV SOLN
2.0000 mg | Freq: Once | INTRAVENOUS | Status: AC
  Administered 2015-11-21: 2 mg via INTRAVENOUS
  Filled 2015-11-21: qty 1

## 2015-11-21 MED ORDER — METOPROLOL SUCCINATE ER 25 MG PO TB24
25.0000 mg | ORAL_TABLET | Freq: Every day | ORAL | Status: DC
  Administered 2015-11-22: 25 mg via ORAL
  Filled 2015-11-21 (×2): qty 1

## 2015-11-21 MED ORDER — LISINOPRIL 20 MG PO TABS
20.0000 mg | ORAL_TABLET | Freq: Every day | ORAL | Status: DC
  Administered 2015-11-21 – 2015-11-23 (×3): 20 mg via ORAL
  Filled 2015-11-21 (×3): qty 1

## 2015-11-21 MED ORDER — ACETAMINOPHEN 650 MG RE SUPP: 650.0000 mg | Freq: Four times a day (QID) | RECTAL | Status: DC | PRN

## 2015-11-21 MED ORDER — MORPHINE SULFATE (PF) 2 MG/ML IV SOLN
1.0000 mg | INTRAVENOUS | Status: DC | PRN
  Administered 2015-11-21 – 2015-11-22 (×2): 1 mg via INTRAVENOUS
  Filled 2015-11-21 (×3): qty 1

## 2015-11-21 MED ORDER — ENOXAPARIN SODIUM 30 MG/0.3ML ~~LOC~~ SOLN
30.0000 mg | SUBCUTANEOUS | Status: DC
  Administered 2015-11-21 – 2015-11-22 (×2): 30 mg via SUBCUTANEOUS
  Filled 2015-11-21 (×2): qty 0.3

## 2015-11-21 MED ORDER — ONDANSETRON HCL 4 MG/2ML IJ SOLN: 4.0000 mg | Freq: Four times a day (QID) | INTRAMUSCULAR | Status: DC | PRN

## 2015-11-21 MED ORDER — DIGOXIN 250 MCG PO TABS
0.2500 mg | ORAL_TABLET | Freq: Every day | ORAL | Status: DC
  Administered 2015-11-21 – 2015-11-22 (×2): 0.25 mg via ORAL
  Filled 2015-11-21 (×3): qty 1

## 2015-11-21 MED ORDER — ONDANSETRON HCL 4 MG PO TABS: 4.0000 mg | ORAL_TABLET | Freq: Four times a day (QID) | ORAL | Status: DC | PRN

## 2015-11-21 MED ORDER — POLYETHYLENE GLYCOL 3350 17 G PO PACK
17.0000 g | PACK | Freq: Every day | ORAL | Status: DC | PRN
  Administered 2015-11-23: 17 g via ORAL
  Filled 2015-11-21: qty 1

## 2015-11-21 MED ORDER — AMITRIPTYLINE HCL 10 MG PO TABS
20.0000 mg | ORAL_TABLET | Freq: Every day | ORAL | Status: DC
  Administered 2015-11-22: 20 mg via ORAL
  Filled 2015-11-21 (×3): qty 2

## 2015-11-21 MED ORDER — AMLODIPINE BESYLATE 5 MG PO TABS
5.0000 mg | ORAL_TABLET | Freq: Every day | ORAL | Status: DC
  Administered 2015-11-21 – 2015-11-23 (×3): 5 mg via ORAL
  Filled 2015-11-21 (×3): qty 1

## 2015-11-21 MED ORDER — INSULIN ASPART 100 UNIT/ML ~~LOC~~ SOLN: 0.0000 [IU] | Freq: Three times a day (TID) | SUBCUTANEOUS | Status: DC

## 2015-11-21 MED ORDER — LEVOTHYROXINE SODIUM 75 MCG PO TABS
75.0000 ug | ORAL_TABLET | Freq: Every day | ORAL | Status: DC
  Administered 2015-11-21: 75 ug via ORAL
  Filled 2015-11-21: qty 1

## 2015-11-21 MED ORDER — ACETAMINOPHEN 325 MG PO TABS: 650.0000 mg | ORAL_TABLET | Freq: Four times a day (QID) | ORAL | Status: DC | PRN

## 2015-11-21 MED ORDER — ENSURE ENLIVE PO LIQD
237.0000 mL | Freq: Three times a day (TID) | ORAL | Status: DC
  Administered 2015-11-22 – 2015-11-23 (×3): 237 mL via ORAL

## 2015-11-21 MED ORDER — SENNOSIDES-DOCUSATE SODIUM 8.6-50 MG PO TABS
2.0000 | ORAL_TABLET | Freq: Every day | ORAL | Status: DC
  Administered 2015-11-22: 2 via ORAL
  Filled 2015-11-21 (×2): qty 2

## 2015-11-21 NOTE — Consult Note (Signed)
Patient is seen for evaluation of multiple pelvic fractures. Contacted this morning and reviewed her films. She has multiple pubic rami fractures that are nonsurgical for treatment. On exam she is confused and is unable to give any history. She does not have pain with logrolling of either leg but she is tender to pressure over the anterior hips  Impression is multiple pelvic fractures with nonoperative treatment planned, she should go get up with physical therapy with adequate pain medication

## 2015-11-21 NOTE — Care Management Important Message (Signed)
Important Message  Patient Details  Name: Melanie Tanner MRN: [id number] Date of Birth: 03/10/1922   Medicare Important Message Given:  Yes    Wilmore Holsomback A, RN 11/21/2015, 1:31 PM

## 2015-11-21 NOTE — Progress Notes (Signed)
Patient is a ward of the state, papers sent via fax from [facility]. Contact information placed in chart. Notified SW of information.

## 2015-11-21 NOTE — ED Notes (Signed)
Pt to ED from [facility] after a fall this evening at 2100. Per EMS, pt with continued pain in right hip along with bruising. Upon arrival pt baseline dementia, bruising noted to right hip, no deformity noted.

## 2015-11-21 NOTE — Progress Notes (Signed)
PCR sent to lab, results pending

## 2015-11-21 NOTE — ED Notes (Signed)
Called [facility] to ask about hx. Facility reports that 2-3 days ago pt became weak and no longer ambulatory. Reports further that pt fell at 2000 Friday night, 1200 Saturday afternoon, and 2100 Saturday evening.

## 2015-11-21 NOTE — Progress Notes (Signed)
This is a 82 year old female admitted for pelvic fracture.  The patient is very demented. Vital signs revealed and the physical examination is done.  1. Pelvic fracture: Multiple closed fractures of the pelvis.  Orthopedic surgery consult, per Dr. Rosita KeaMenz, multiple pelvic fractures with nonoperative treatment planned, she should go get up with physical therapy with adequate pain medication.  2. Hypertension: Controlled; continue amlodipine and lisinopril  3. Atrial fibrillation: Rate controlled; continue digoxin and metoprolol  4. Hypothyroidism: Continue Synthroid  5. Dementia: continue Elavil and Seroquel, aspiration and fall precaution.  6. DVT prophylaxis: Heparin

## 2015-11-21 NOTE — ED Provider Notes (Signed)
[facility] Emergency Department Provider Note  ____________________________________________  Time seen: Approximately 5:03 AM  I have reviewed the triage vital signs and the nursing notes.   HISTORY  Chief Complaint Hip Pain and Fall  History limited by chronic dementia  HPI Melanie Tanner is a 82 y.o. female with history of chronic dementia who presents by EMS for evaluation of multiple falls recently.  Reportedly the patient has had gradual onset over the last 2-3 days of difficulty with ambulation and has had multiple falls.  She normally is fully ambulatory but can no longer do so by herself.  She had another fall tonight several hours prior to arrival but they documented at least 3 falls over the last 2-3 days.  The patient reports pain in her pelvis but is not able to further characterize the pain except that it is severe..  She denies chest pain and shortness of breath.   Past Medical History  Diagnosis Date  . Hypertension   . Thyroid disease   . Atrial fibrillation [facility])     Patient Active Problem List   Diagnosis Date Noted  . Multiple pelvic fractures (HCC) 11/21/2015  . Dehydration 08/22/2015    Past Surgical History  Procedure Laterality Date  . Hip surgery      Current Outpatient Rx  Name  Route  Sig  Dispense  Refill  . acetaminophen (TYLENOL) 325 MG tablet   Oral   Take 650 mg by mouth every 8 (eight) hours as needed for mild pain, moderate pain or fever.         Marland Kitchen amitriptyline (ELAVIL) 10 MG tablet   Oral   Take 20 mg by mouth at bedtime.         Marland Kitchen amLODipine (NORVASC) 5 MG tablet   Oral   Take 1 tablet by mouth daily.      0   . digoxin (LANOXIN) 0.25 MG tablet   Oral   Take 0.25 mg by mouth daily.         . feeding supplement, ENSURE ENLIVE, (ENSURE ENLIVE) LIQD   Oral   Take 237 mLs by mouth 3 (three) times daily with meals.   237 mL   12   . levothyroxine (SYNTHROID, LEVOTHROID) 75 MCG tablet  Oral   Take 1 tablet by mouth daily.      0   . lisinopril (PRINIVIL,ZESTRIL) 20 MG tablet   Oral   Take 1 tablet by mouth daily.      0   . metoprolol succinate (TOPROL-XL) 25 MG 24 hr tablet   Oral   Take 1 tablet (25 mg total) by mouth at bedtime. Patient taking differently: Take 25 mg by mouth daily.    30 tablet   0   . mirtazapine (REMERON) 7.5 MG tablet   Oral   Take 7.5 mg by mouth at bedtime.         . polyethylene glycol (MIRALAX / GLYCOLAX) packet   Oral   Take 17 g by mouth daily as needed for mild constipation.         . senna-docusate (SENOKOT-S) 8.6-50 MG tablet   Oral   Take 2 tablets by mouth at bedtime.         Marland Kitchen QUEtiapine (SEROQUEL) 25 MG tablet   Oral   Take 1 tablet (25 mg total) by mouth at bedtime. Patient not taking: Reported on 11/21/2015   30 tablet   0     Allergies  Review of patient's allergies indicates no known allergies.  Family History  Problem Relation Age of Onset  . Hypertension Son     Social History Social History  Substance Use Topics  . Smoking status: Never Smoker   . Smokeless tobacco: None  . Alcohol Use: No    Review of Systems Unable to obtain from the patient ____________________________________________   PHYSICAL EXAM:  VITAL SIGNS: ED Triage Vitals  Enc Vitals Group     BP 11/21/15 0253 154/60 mmHg     Pulse Rate 11/21/15 0253 80     Resp 11/21/15 0253 18     Temp 11/21/15 0253 97.7 F (36.5 C)     Temp Source 11/21/15 0253 Oral     SpO2 11/21/15 0253 92 %     Weight --      Height --      Head Cir --      Peak Flow --      Pain Score 11/21/15 0254 7     Pain Loc --      Pain Edu? --      Excl. in GC? --     Constitutional: Elderly, in mild distress Eyes: Conjunctivae are normal. PERRL. EOMI. Head: Atraumatic. Nose: No congestion/rhinnorhea. Mouth/Throat: Mucous membranes are moist.  Oropharynx non-erythematous. Neck: No stridor.  No meningeal signs.  No cervical spine  tenderness to palpation. Cardiovascular: Normal rate, regular rhythm. Good peripheral circulation. Grossly normal heart sounds.   Respiratory: Normal respiratory effort.  No retractions. Lungs CTAB. Gastrointestinal: Soft with tenderness to palpation of the pelvis but not specifically of the abdomen.  Nondistended. Musculoskeletal: Bruising to her hips.  Severe pain/tenderness with any attempt to range her lower extremities.  Tenderness to palpation of the pelvis and suprapubic region.  Abdomen is soft and nondistended.   Neurologic:  Normal speech and language. No gross focal neurologic deficits are appreciated.   ____________________________________________   LABS (all labs ordered are listed, but only abnormal results are displayed)  Labs Reviewed  CBC WITH DIFFERENTIAL/PLATELET - Abnormal; Notable for the following:    WBC 15.8 (*)    RBC 3.70 (*)    Hemoglobin 11.8 (*)    Platelets 112 (*)    Neutro Abs 14.3 (*)    Lymphs Abs 0.3 (*)    Monocytes Absolute 1.2 (*)    All other components within normal limits  COMPREHENSIVE METABOLIC PANEL - Abnormal; Notable for the following:    Chloride 98 (*)    Glucose, Bld 130 (*)    BUN 33 (*)    GFR calc non Af Amer 50 (*)    GFR calc Af Amer 58 (*)    All other components within normal limits  URINALYSIS COMPLETEWITH MICROSCOPIC ([facility] ONLY) - Abnormal; Notable for the following:    Color, Urine YELLOW (*)    APPearance CLEAR (*)    Ketones, ur TRACE (*)    Squamous Epithelial / LPF 0-5 (*)    All other components within normal limits  TROPONIN I - Abnormal; Notable for the following:    Troponin I 0.08 (*)    All other components within normal limits  URINE CULTURE  MAGNESIUM  PROTIME-INR   ____________________________________________  EKG  ED ECG REPORT I, Joshawn Crissman, the attending physician, personally viewed and interpreted this ECG.   Date: 11/21/2015  EKG Time: 04:53  Rate: 78  Rhythm: Accelerated junctional  rhythm  Axis: Right axis deviation  Intervals:Accelerated junctional rhythm, QTC is slightly prolonged at  518 ms  ST&T Change: Artifact is present but the patient has no clear evidence of acute ischemia at this time.  ____________________________________________  RADIOLOGY   Dg Chest Portable 1 View  11/21/2015  CLINICAL DATA:  Weakness for 2-3 days.  Leukocytosis.  Recent falls. EXAM: PORTABLE CHEST 1 VIEW COMPARISON:  04/09/2010 FINDINGS: Normal heart size. Normal pulmonary vascularity. Calcified and ectatic aorta. Emphysematous changes in the lungs. Interstitial fibrosis. Increased density in the medial right lung is probably due to patient rotation and soft tissue attenuation. No definite consolidation. No blunting of costophrenic angles. No pneumothorax. IMPRESSION: Emphysematous changes and scattered fibrosis in the lungs. No evidence of active pulmonary disease. Calcified and dilated aorta. Electronically Signed   By: Burman Nieves M.D.   On: 11/21/2015 06:09   Dg Hip Unilat  With Pelvis 2-3 Views Right  11/21/2015  CLINICAL DATA:  Right hip pain and bruising after a fall. EXAM: DG HIP (WITH OR WITHOUT PELVIS) 2-3V RIGHT COMPARISON:  Left hip 04/09/2010 FINDINGS: Postoperative left hip hemiarthroplasty with non cemented component. Component appears well seated. No acute fracture or dislocation of the left hip. Acute displaced fractures of the left superior and inferior pubic rami and of the right superior and inferior pubic rami. Irregularity of the sacral struts with diffuse bone demineralization possibly indicating insufficiency fractures. SI joints and symphysis pubis are nondisplaced. Calcified phleboliths in the pelvis. Degenerative changes in the lower lumbar spine. Right hip demonstrates no evidence of acute fracture or dislocation. IMPRESSION: Acute displaced fractures of the superior and inferior pubic rami bilaterally. Possible nondisplaced sacral insufficiency fractures. Old left  hip arthroplasty. No acute fracture or dislocation in the hips. Electronically Signed   By: Burman Nieves M.D.   On: 11/21/2015 03:44    ____________________________________________   PROCEDURES  Procedure(s) performed: None  Critical Care performed: No ____________________________________________   INITIAL IMPRESSION / ASSESSMENT AND PLAN / ED COURSE  Pertinent labs & imaging results that were available during my care of the patient were reviewed by me and considered in my medical decision making (see chart for details).  I spoke with phone with Dr. Rosita Kea who reviewed the plain films and feels that there is no evidence for acute surgical intervention at this time a son the fractures although we did discuss the fact that there are superior and inferior pubic rami fractures bilaterally.  The patient is in a significant amount of pain and completely unable to ambulate.  I will admit her for pain control, inpatient orthopedic evaluation, PT/OT, and placement.  She has a leukocytosis but this may be stress related, similarly she has a troponin of 0.08 which may be stress related as well (no chest pain nor shortness of breath).  Although she has multiple pelvic fractures, there is no indication that she is bleeding into her abdomen or that these are unstable injuries that require trauma or immediate surgical intervention.  Again, Dr. Rosita Kea did not feel these were operative fractures.  We will attempt to control the patient's pain and continue to monitor her.   ____________________________________________  FINAL CLINICAL IMPRESSION(S) / ED DIAGNOSES  Final diagnoses:  Closed fracture of right inferior pubic ramus, initial encounter (HCC)  Closed fracture of right superior pubic ramus, initial encounter (HCC)  Closed fracture of left superior pubic ramus, initial encounter (HCC)  Closed fracture of left inferior pubic ramus, initial encounter (HCC)  Elevated troponin I level  Intractable  pain  Chronic dementia, without behavioral disturbance  DNR (do not resuscitate)  NEW OUTPATIENT MEDICATIONS STARTED DURING THIS VISIT:  New Prescriptions   No medications on file      Note:  This document was prepared using Dragon voice recognition software and may include unintentional dictation errors.   Loleta Rose, MD 11/21/15 [phone]

## 2015-11-21 NOTE — ED Notes (Signed)
Pt transported by ED RN. PT clean and dry upon transfer. PT in NAD, RR even and unlabored. Pt leaves with oxygen [place] in hand, will not wear it on face.

## 2015-11-21 NOTE — Progress Notes (Addendum)
Anticoagulation monitoring(Lovenox):  82 yo  ordered Lovenox 40 mg Q24h  Filed Weights   11/21/15 1108  Weight: 96 lb (43.545 kg)   BMI  Lab Results  Component Value Date   CREATININE 0.96 11/21/2015   CREATININE 1.52* 09/02/2015   CREATININE 0.85 08/23/2015   Estimated Creatinine Clearance: 26.2 mL/min (by C-G formula based on Cr of 0.96). Hemoglobin & Hematocrit     Component Value Date/Time   HGB 11.8* 11/21/2015 0443   HCT 35.4 11/21/2015 0443     Per Protocol for Patient with estCrcl< 30 ml/min and BMI < 40, will transition to Lovenox 30 mg Q24h.

## 2015-11-21 NOTE — H&P (Signed)
Melanie Tanner is an 82 y.o. female.   Chief Complaint: Fall HPI: The patient presents to the emergency department from her nursing home after suffering a fall. X-ray of her pelvis revealed multiple fractures. The patient denies significant pain. She has a history of hip fracture in the past. A clear history cannot be obtained from the patient as she does speak intelligibly. Orthopedic surgery was consulted who will follow the patient after admission to the hospitalist service for medical management.  Past Medical History  Diagnosis Date  . Hypertension   . Thyroid disease   . Atrial fibrillation [facility])     Past Surgical History  Procedure Laterality Date  . Hip surgery      Family History  Problem Relation Age of Onset  . Hypertension Son    Social History:  reports that she has never smoked. She does not have any smokeless tobacco history on file. She reports that she does not drink alcohol or use illicit drugs.  Allergies: No Known Allergies  Prior to Admission medications   Medication Sig Start Date End Date Taking? Authorizing Provider  acetaminophen (TYLENOL) 325 MG tablet Take 650 mg by mouth every 8 (eight) hours as needed for mild pain, moderate pain or fever.   Yes Historical Provider, MD  amitriptyline (ELAVIL) 10 MG tablet Take 20 mg by mouth at bedtime.   Yes Historical Provider, MD  amLODipine (NORVASC) 5 MG tablet Take 1 tablet by mouth daily. 08/13/15  Yes Historical Provider, MD  digoxin (LANOXIN) 0.25 MG tablet Take 0.25 mg by mouth daily.   Yes Historical Provider, MD  feeding supplement, ENSURE ENLIVE, (ENSURE ENLIVE) LIQD Take 237 mLs by mouth 3 (three) times daily with meals. 08/24/15  Yes Loletha Grayer, MD  levothyroxine (SYNTHROID, LEVOTHROID) 75 MCG tablet Take 1 tablet by mouth daily. 08/13/15  Yes Historical Provider, MD  lisinopril (PRINIVIL,ZESTRIL) 20 MG tablet Take 1 tablet by mouth daily. 08/13/15  Yes Historical Provider, MD  metoprolol succinate  (TOPROL-XL) 25 MG 24 hr tablet Take 1 tablet (25 mg total) by mouth at bedtime. Patient taking differently: Take 25 mg by mouth daily.  08/24/15  Yes Loletha Grayer, MD  mirtazapine (REMERON) 7.5 MG tablet Take 7.5 mg by mouth at bedtime.   Yes Historical Provider, MD  polyethylene glycol (MIRALAX / GLYCOLAX) packet Take 17 g by mouth daily as needed for mild constipation.   Yes Historical Provider, MD  senna-docusate (SENOKOT-S) 8.6-50 MG tablet Take 2 tablets by mouth at bedtime.   Yes Historical Provider, MD  QUEtiapine (SEROQUEL) 25 MG tablet Take 1 tablet (25 mg total) by mouth at bedtime. Patient not taking: Reported on 11/21/2015 08/24/15   Loletha Grayer, MD     Results for orders placed or performed during the hospital encounter of 11/21/15 (from the past 48 hour(s))  CBC with Differential/Platelet     Status: Abnormal   Collection Time: 11/21/15  4:43 AM  Result Value Ref Range   WBC 15.8 (H) 3.6 - 11.0 K/uL   RBC 3.70 (L) 3.80 - 5.20 MIL/uL   Hemoglobin 11.8 (L) 12.0 - 16.0 g/dL   HCT 35.4 35.0 - 47.0 %   MCV 95.7 80.0 - 100.0 fL   MCH 32.0 26.0 - 34.0 pg   MCHC 33.4 32.0 - 36.0 g/dL   RDW 13.2 11.5 - 14.5 %   Platelets 112 (L) 150 - 440 K/uL   Neutrophils Relative % 91% %   Neutro Abs 14.3 (H) 1.4 - 6.5  K/uL   Lymphocytes Relative 2% %   Lymphs Abs 0.3 (L) 1.0 - 3.6 K/uL   Monocytes Relative 7% %   Monocytes Absolute 1.2 (H) 0.2 - 0.9 K/uL   Eosinophils Relative 0% %   Eosinophils Absolute 0.0 0 - 0.7 K/uL   Basophils Relative 0% %   Basophils Absolute 0.0 0 - 0.1 K/uL  Comprehensive metabolic panel     Status: Abnormal   Collection Time: 11/21/15  4:43 AM  Result Value Ref Range   Sodium 136 135 - 145 mmol/L   Potassium 3.8 3.5 - 5.1 mmol/L   Chloride 98 (L) 101 - 111 mmol/L   CO2 30 22 - 32 mmol/L   Glucose, Bld 130 (H) 65 - 99 mg/dL   BUN 33 (H) 6 - 20 mg/dL   Creatinine, Ser 0.96 0.44 - 1.00 mg/dL   Calcium 9.8 8.9 - 10.3 mg/dL   Total Protein 7.1 6.5 - 8.1  g/dL   Albumin 4.4 3.5 - 5.0 g/dL   AST 32 15 - 41 U/L   ALT 23 14 - 54 U/L   Alkaline Phosphatase 79 38 - 126 U/L   Total Bilirubin 1.2 0.3 - 1.2 mg/dL   GFR calc non Af Amer 50 (L) >60 mL/min   GFR calc Af Amer 58 (L) >60 mL/min    Comment: (NOTE) The eGFR has been calculated using the CKD EPI equation. This calculation has not been validated in all clinical situations. eGFR's persistently <60 mL/min signify possible Chronic Kidney Disease.    Anion gap 8 5 - 15  Troponin I     Status: Abnormal   Collection Time: 11/21/15  4:43 AM  Result Value Ref Range   Troponin I 0.08 (H) <0.031 ng/mL    Comment: READ BACK AND VERIFIED WITH REBECCA LYNN AT 9678 ON 11/21/15.Marland KitchenMarland KitchenComfrey        PERSISTENTLY INCREASED TROPONIN VALUES IN THE RANGE OF 0.04-0.49 ng/mL CAN BE SEEN IN:       -UNSTABLE ANGINA       -CONGESTIVE HEART FAILURE       -MYOCARDITIS       -CHEST TRAUMA       -ARRYHTHMIAS       -LATE PRESENTING MYOCARDIAL INFARCTION       -COPD   CLINICAL FOLLOW-UP RECOMMENDED.   Magnesium     Status: None   Collection Time: 11/21/15  4:43 AM  Result Value Ref Range   Magnesium 1.9 1.7 - 2.4 mg/dL  Protime-INR     Status: None   Collection Time: 11/21/15  4:43 AM  Result Value Ref Range   Prothrombin Time 14.1 11.4 - 15.0 seconds   INR 1.07   Urinalysis complete, with microscopic ([facility] only)     Status: Abnormal   Collection Time: 11/21/15  5:20 AM  Result Value Ref Range   Color, Urine YELLOW (A) YELLOW   APPearance CLEAR (A) CLEAR   Glucose, UA NEGATIVE NEGATIVE mg/dL   Bilirubin Urine NEGATIVE NEGATIVE   Ketones, ur TRACE (A) NEGATIVE mg/dL   Specific Gravity, Urine 1.016 1.005 - 1.030   Hgb urine dipstick NEGATIVE NEGATIVE   pH 5.0 5.0 - 8.0   Protein, ur NEGATIVE NEGATIVE mg/dL   Nitrite NEGATIVE NEGATIVE   Leukocytes, UA NEGATIVE NEGATIVE   RBC / HPF 0-5 0 - 5 RBC/hpf   WBC, UA 0-5 0 - 5 WBC/hpf   Bacteria, UA NONE SEEN NONE SEEN   Squamous Epithelial / LPF 0-5 (A) NONE  SEEN   Mucous PRESENT    Dg Chest Portable 1 View  11/21/2015  CLINICAL DATA:  Weakness for 2-3 days.  Leukocytosis.  Recent falls. EXAM: PORTABLE CHEST 1 VIEW COMPARISON:  04/09/2010 FINDINGS: Normal heart size. Normal pulmonary vascularity. Calcified and ectatic aorta. Emphysematous changes in the lungs. Interstitial fibrosis. Increased density in the medial right lung is probably due to patient rotation and soft tissue attenuation. No definite consolidation. No blunting of costophrenic angles. No pneumothorax. IMPRESSION: Emphysematous changes and scattered fibrosis in the lungs. No evidence of active pulmonary disease. Calcified and dilated aorta. Electronically Signed   By: Lucienne Capers M.D.   On: 11/21/2015 06:09   Dg Hip Unilat  With Pelvis 2-3 Views Right  11/21/2015  CLINICAL DATA:  Right hip pain and bruising after a fall. EXAM: DG HIP (WITH OR WITHOUT PELVIS) 2-3V RIGHT COMPARISON:  Left hip 04/09/2010 FINDINGS: Postoperative left hip hemiarthroplasty with non cemented component. Component appears well seated. No acute fracture or dislocation of the left hip. Acute displaced fractures of the left superior and inferior pubic rami and of the right superior and inferior pubic rami. Irregularity of the sacral struts with diffuse bone demineralization possibly indicating insufficiency fractures. SI joints and symphysis pubis are nondisplaced. Calcified phleboliths in the pelvis. Degenerative changes in the lower lumbar spine. Right hip demonstrates no evidence of acute fracture or dislocation. IMPRESSION: Acute displaced fractures of the superior and inferior pubic rami bilaterally. Possible nondisplaced sacral insufficiency fractures. Old left hip arthroplasty. No acute fracture or dislocation in the hips. Electronically Signed   By: Lucienne Capers M.D.   On: 11/21/2015 03:44    Review of Systems  Unable to perform ROS: dementia    Blood pressure 137/61, pulse 81, temperature 97.7 F (36.5  C), temperature source Oral, resp. rate 18, SpO2 91 %. Physical Exam  Vitals reviewed. Constitutional: She is oriented to person, place, and time. She appears well-developed and well-nourished.  HENT:  Head: Normocephalic and atraumatic.  Mouth/Throat: Oropharynx is clear and moist.  Eyes: Conjunctivae and EOM are normal. Pupils are equal, round, and reactive to light. No scleral icterus.  Neck: Normal range of motion. Neck supple. No JVD present. No tracheal deviation present. No thyromegaly present.  Cardiovascular: Normal rate and normal heart sounds.  An irregularly irregular rhythm present. Exam reveals no gallop and no friction rub.   No murmur heard. Respiratory: Effort normal and breath sounds normal.  GI: Soft. Bowel sounds are normal. She exhibits no distension. There is no tenderness.  Genitourinary:  Deferred  Musculoskeletal: She exhibits no edema.  Range of motion in lower extremities limited by pain  Lymphadenopathy:    She has no cervical adenopathy.  Neurological: She is alert and oriented to person, place, and time. No cranial nerve deficit. She exhibits normal muscle tone.  Skin: Skin is warm and dry. No rash noted. No erythema.  Psychiatric:  Difficult to assess mental status as the patient does not speak intelligibly secondary to dementia; she is not agitated     Assessment/Plan This is a 82 year old female admitted for pelvic fracture. 1. Pelvic fracture: Multiple closed fractures of the pelvis. The patient has severe dementia and is very frail. She is not a good surgical candidate. Orthopedic surgery to consult. 2. Hypertension: Controlled; continue amlodipine and lisinopril 3. Atrial fibrillation: Rate controlled; continue digoxin and metoprolol 4. Hypothyroidism: Continue Synthroid 5. Dementia: continue Elavil and Seroquel 6. DVT prophylaxis: Heparin 7. GI prophylaxis: None The patient is a DO  NOT RESUSCITATE. Time spent on admission orders and patient care  approximately 45 minutes  Harrie Foreman, MD 11/21/2015, 7:33 AM

## 2015-11-21 NOTE — Care Management Note (Signed)
Case Management Note  Patient Details  Name: Jamse MeadMargaret B Niedermeier MRN: [id number] Date of Birth: 03/09/1922  Subjective/Objective:       82yo Mrs Fay RecordsMargaret Schuman was admiited on 11/21/15 after several falls with multiple closed pelvic fractures. [facility], Mrs Derrell LollingJobe is currently in DSS custody and that [facility]. Family may visit but DSS is to be consulted per any placement or discharge decisions. Evaluated by Dr Rosita KeaMenz who recommends no surgical intervention.  Hx: Dementia, A-Fib, Hypothyroidism, HTN.  CSW consult and CSW will coordinate discharge placement. Anticipate need for a higher level of care than a ALF.          Action/Plan:   Expected Discharge Date:                  Expected Discharge Plan:     In-House Referral:     Discharge planning Services     Post Acute Care Choice:    Choice offered to:     DME Arranged:    DME Agency:     HH Arranged:    HH Agency:     Status of Service:     Medicare Important Message Given:  Yes Date Medicare IM Given:    Medicare IM give by:    Date Additional Medicare IM Given:    Additional Medicare Important Message give by:     If discussed at Long Length of Stay Meetings, dates discussed:    Additional Comments:  Sarkis Rhines A, RN 11/21/2015, 2:16 PM

## 2015-11-22 LAB — CBC
HEMATOCRIT: 30.5 % — AB (ref 35.0–47.0)
Hemoglobin: 10.2 g/dL — ABNORMAL LOW (ref 12.0–16.0)
MCH: 32.3 pg (ref 26.0–34.0)
MCHC: 33.6 g/dL (ref 32.0–36.0)
MCV: 96.1 fL (ref 80.0–100.0)
Platelets: 91 10*3/uL — ABNORMAL LOW (ref 150–440)
RBC: 3.17 MIL/uL — ABNORMAL LOW (ref 3.80–5.20)
RDW: 13.1 % (ref 11.5–14.5)
WBC: 7.8 10*3/uL (ref 3.6–11.0)

## 2015-11-22 LAB — BASIC METABOLIC PANEL
Anion gap: 5 (ref 5–15)
BUN: 38 mg/dL — AB (ref 6–20)
CHLORIDE: 103 mmol/L (ref 101–111)
CO2: 30 mmol/L (ref 22–32)
Calcium: 8.8 mg/dL — ABNORMAL LOW (ref 8.9–10.3)
Creatinine, Ser: 1.06 mg/dL — ABNORMAL HIGH (ref 0.44–1.00)
GFR calc Af Amer: 52 mL/min — ABNORMAL LOW (ref >60)
GFR calc non Af Amer: 44 mL/min — ABNORMAL LOW (ref >60)
GLUCOSE: 102 mg/dL — AB (ref 65–99)
POTASSIUM: 3.8 mmol/L (ref 3.5–5.1)
Sodium: 138 mmol/L (ref 135–145)

## 2015-11-22 LAB — GLUCOSE, CAPILLARY
Glucose-Capillary: 99 mg/dL (ref 65–99)
Glucose-Capillary: 121 mg/dL — ABNORMAL HIGH (ref 65–99)
Glucose-Capillary: 116 mg/dL — ABNORMAL HIGH (ref 65–99)
Glucose-Capillary: 117 mg/dL — ABNORMAL HIGH (ref 65–99)

## 2015-11-22 LAB — URINE CULTURE
CULTURE: NO GROWTH
Special Requests: NORMAL

## 2015-11-22 MED ORDER — SODIUM CHLORIDE 0.9 % IV SOLN
INTRAVENOUS | Status: AC
  Administered 2015-11-22 – 2015-11-23 (×2): via INTRAVENOUS

## 2015-11-22 MED ORDER — OXYCODONE-ACETAMINOPHEN 5-325 MG PO TABS
1.0000 | ORAL_TABLET | Freq: Four times a day (QID) | ORAL | Status: DC | PRN
  Administered 2015-11-23 (×2): 1 via ORAL
  Filled 2015-11-22 (×2): qty 1

## 2015-11-22 MED ORDER — DIGOXIN 250 MCG PO TABS
0.1250 mg | ORAL_TABLET | Freq: Every day | ORAL | Status: DC
  Administered 2015-11-23: 0.125 mg via ORAL
  Filled 2015-11-22: qty 1

## 2015-11-22 NOTE — Clinical Social Work Note (Signed)
Clinical Social Work Assessment  Patient Details  Name: Melanie MeadMargaret B Fiorini MRN: [id number] Date of Birth: 11/22/1921  Date of referral:  11/22/15               Reason for consult:  Facility Placement, Other (Comment Required) (From [facility] )                Permission sought to share information with:  Facility Industrial/product designerContact Representative Permission granted to share information::  Yes, Verbal Permission Granted  Name::      [facility]   Agency::     Relationship::     Contact Information:     Housing/Transportation Living arrangements for the past 2 months:  Assisted Living Facility Source of Information:  Facility, Guardian (DSS Guardian Rhesa Recruitment consultantCarr ) Patient Interpreter Needed:  None Criminal Activity/Legal Involvement Pertinent to Current Situation/Hospitalization:  No - Comment as needed Significant Relationships:  Adult Children, Other(Comment) (DSS Guardian ) Lives with:  Facility Resident Do you feel safe going back to the place where you live?    Need for family participation in patient care:  No (Coment)  Care giving concerns:  Patient is a resident at [facility].    Social Worker assessment / plan:  Visual merchandiserClinical Social Worker (CSW) received consult that patient is from [facility] and has a DSS guardian. CSW contacted ChiropractorBonnie administrator at [facility]. Per Kendal HymenBonnie patient's guardian is Rhesa Recruitment consultantCarr with [facility]. Per Kendal HymenBonnie [facility] now. Per Kendal HymenBonnie patient's baseline is laying in the bed and refusing to get up. Per Kendal HymenBonnie patient can walk however she prefers to stay in the bed. Per Kendal HymenBonnie patient will not be a good rehab candidate and can return to [facility]. CSW contacted patient's guardian Marcelle OverlieRhesa Carr and made her aware of above. Guardian is agreeable for patient to return to [facility]. CSW attempted to meet with patient however she was asleep and her son Onalee HuaDavid was at bedside. Onalee HuaDavid asked what is medically going on  with patient and CSW made him aware that no information can be given and referred him Rhesa DSS guardian.   FL2 complete. CSW will continue to follow and assist as needed.   Employment status:  Disabled (Comment on whether or not currently receiving Disability), Retired Database administratornsurance information:  Managed Medicare PT Recommendations:  Not assessed at this time Information / Referral to community resources:  Other (Comment Required) (Patient will return to [facility] )  Patient/Family's Response to care:  Guardian is agreeable for patient to return to [facility].   Patient/Family's Understanding of and Emotional Response to Diagnosis, Current Treatment, and Prognosis:  Patient was asleep and could not participate in assessment.   Emotional Assessment Appearance:  Appears stated age Attitude/Demeanor/Rapport:  Unable to Assess Affect (typically observed):  Unable to Assess Orientation:  Oriented to Self Alcohol / Substance use:  Not Applicable Psych involvement (Current and /or in the community):  No (Comment)  Discharge Needs  Concerns to be addressed:  Discharge Planning Concerns Readmission within the last 30 days:  No Current discharge risk:  Cognitively Impaired, Chronically ill Barriers to Discharge:  Continued Medical Work up   Haig ProphetMorgan, Marveline Profeta G, LCSW 11/22/2015, 2:01 PM

## 2015-11-22 NOTE — Progress Notes (Signed)
[facility] [place] [facility]   PATIENT NAME: [name]    MR#:  [id number]  DATE OF BIRTH:  04/24/1922  SUBJECTIVE:  CHIEF COMPLAINT:   Chief Complaint  Patient presents with  . Hip Pain  . Fall   The patient is very demented.  REVIEW OF SYSTEMS:  Unable to get ROS.  DRUG ALLERGIES:  No Known Allergies  VITALS:  Blood pressure 126/47, pulse 74, temperature 97.8 F (36.6 C), temperature source Oral, resp. rate 18, height 5\' 4"  (1.626 m), weight 45.813 kg (101 lb), SpO2 91 %.  PHYSICAL EXAMINATION:  GENERAL:  82 y.o.-year-old patient lying in the bed with no acute distress. Thin and malnutrition. EYES: Pupils equal, round, reactive to light and accommodation. No scleral icterus. Extraocular muscles intact.  HEENT: Head atraumatic, normocephalic.  NECK:  Supple, no jugular venous distention. No thyroid enlargement, no tenderness.  LUNGS: Normal breath sounds bilaterally, no wheezing, rales,rhonchi or crepitation. No use of accessory muscles of respiration.  CARDIOVASCULAR: S1, S2 normal. No murmurs, rubs, or gallops.  ABDOMEN: Soft, diffuse tenderness, nondistended. Bowel sounds present. No organomegaly or mass.  EXTREMITIES: No pedal edema, cyanosis, or clubbing.  NEUROLOGIC: unable to exam. PSYCHIATRIC: The patient is demented.  SKIN: No obvious rash, lesion, or ulcer.    LABORATORY PANEL:   CBC  Recent Labs Lab 11/22/15 0344  WBC 7.8  HGB 10.2*  HCT 30.5*  PLT 91*   ------------------------------------------------------------------------------------------------------------------  Chemistries   Recent Labs Lab 11/21/15 0443 11/22/15 0344  NA 136 138  K 3.8 3.8  CL 98* 103  CO2 30 30  GLUCOSE 130* 102*  BUN 33* 38*  CREATININE 0.96 1.06*  CALCIUM 9.8 8.8*  MG 1.9  --   AST 32  --   ALT 23  --   ALKPHOS 79  --   BILITOT 1.2  --     ------------------------------------------------------------------------------------------------------------------  Cardiac Enzymes  Recent Labs Lab 11/21/15 0443  TROPONINI 0.08*   ------------------------------------------------------------------------------------------------------------------  RADIOLOGY:  Dg Chest Portable 1 View  11/21/2015  CLINICAL DATA:  Weakness for 2-3 days.  Leukocytosis.  Recent falls. EXAM: PORTABLE CHEST 1 VIEW COMPARISON:  04/09/2010 FINDINGS: Normal heart size. Normal pulmonary vascularity. Calcified and ectatic aorta. Emphysematous changes in the lungs. Interstitial fibrosis. Increased density in the medial right lung is probably due to patient rotation and soft tissue attenuation. No definite consolidation. No blunting of costophrenic angles. No pneumothorax. IMPRESSION: Emphysematous changes and scattered fibrosis in the lungs. No evidence of active pulmonary disease. Calcified and dilated aorta. Electronically Signed   By: Burman NievesWilliam  Stevens M.D.   On: 11/21/2015 06:09   Dg Hip Unilat  With Pelvis 2-3 Views Right  11/21/2015  CLINICAL DATA:  Right hip pain and bruising after a fall. EXAM: DG HIP (WITH OR WITHOUT PELVIS) 2-3V RIGHT COMPARISON:  Left hip 04/09/2010 FINDINGS: Postoperative left hip hemiarthroplasty with non cemented component. Component appears well seated. No acute fracture or dislocation of the left hip. Acute displaced fractures of the left superior and inferior pubic rami and of the right superior and inferior pubic rami. Irregularity of the sacral struts with diffuse bone demineralization possibly indicating insufficiency fractures. SI joints and symphysis pubis are nondisplaced. Calcified phleboliths in the pelvis. Degenerative changes in the lower lumbar spine. Right hip demonstrates no evidence of acute fracture or dislocation. IMPRESSION: Acute displaced fractures of the superior and inferior pubic rami bilaterally. Possible nondisplaced  sacral insufficiency fractures. Old left hip arthroplasty. No acute fracture  or dislocation in the hips. Electronically Signed   By: Burman Nieves M.D.   On: 11/21/2015 03:44    EKG:   Orders placed or performed during the hospital encounter of 11/21/15  . ED EKG  . ED EKG  . EKG 12-Lead  . EKG 12-Lead    ASSESSMENT AND PLAN:   This is a 82 year old female admitted for pelvic fracture.  1. Pelvic fracture: Multiple closed fractures of the pelvis.  Orthopedic surgery consult, per Dr. Rosita Kea, multiple pelvic fractures with nonoperative treatment planned, she should go get up with physical therapy with adequate pain medication.  2. Hypertension: Controlled; continue amlodipine and lisinopril  3. Atrial fibrillation: Rate controlled; continue digoxin and metoprolol  4. Hypothyroidism: Continue Synthroid  5. Dementia: continue Elavil and Seroquel, aspiration and fall precaution.  6. DVT prophylaxis: Heparin.  Dehydration. Start NS iv, f/u BMP. Poor oral intake and malnutrition, dietitian consult.   All the records are reviewed and case discussed with Care Management/Social Workerr. Management plans discussed with the patient, family and they are in agreement.  CODE STATUS: DNR  TOTAL TIME TAKING CARE OF THIS PATIENT: 32 minutes.  Greater than 50% time was spent on coordination of care and face-to-face counseling.  POSSIBLE D/C IN 2 DAYS, DEPENDING ON CLINICAL CONDITION.   Shaune Pollack M.D on 11/22/2015 at 4:12 PM  Between 7am to 6pm - Pager - [phone]  After 6pm go to [email or web address] - password [email or web address] [facility]  [place] [facility]  Office  [phone]  CC: Primary care physician; No primary care provider on file.

## 2015-11-22 NOTE — NC FL2 (Signed)
[place] MEDICAID FL2 LEVEL OF CARE SCREENING TOOL     IDENTIFICATION  Patient Name: Melanie Tanner Birthdate: 02/03/1922 Sex: female Admission Date (Current Location): 11/21/2015  [place]   Facility and Address:  [facility], [address], [place], [place] [address]      Provider Number: [id number]  Attending Physician Name and Address:  Shaune PollackQing Pheonix Wisby, MD  Relative Name and Phone Number:       Current Level of Care: Hospital Recommended Level of Care: Skilled Nursing Facility Prior Approval Number:    Date Approved/Denied:   PASRR Number:  [id number])  Discharge Plan: Domiciliary (Rest home)    Current Diagnoses: Patient Active Problem List   Diagnosis Date Noted  . Multiple pelvic fractures (HCC) 11/21/2015  . Dehydration 08/22/2015    Orientation RESPIRATION BLADDER Height & Weight     Self  Normal Incontinent Weight: 101 lb (45.813 kg) Height:  5\' 4"  (162.6 cm)  BEHAVIORAL SYMPTOMS/MOOD NEUROLOGICAL BOWEL NUTRITION STATUS   (none )  (none ) Continent Diet (Diet: Soft )  AMBULATORY STATUS COMMUNICATION OF NEEDS Skin   Limited Assist Verbally Normal                       Personal Care Assistance Level of Assistance  Bathing, Feeding, Dressing Bathing Assistance: Limited assistance Feeding assistance: Limited assistance Dressing Assistance: Limited assistance     Functional Limitations Info  Sight, Hearing, Speech Sight Info: Adequate Hearing Info: Adequate Speech Info: Adequate    SPECIAL CARE FACTORS FREQUENCY  PT (By licensed PT)     PT Frequency:  (Home Health PT via [facility] 2-3 days per week )              Contractures      Additional Factors Info  Code Status, Allergies Code Status Info:  (DNR. ) Allergies Info:  (No Known Allergies. )           Current Medications (11/22/2015):  This is the current hospital active medication list Current Facility-Administered  Medications  Medication Dose Route Frequency Provider Last Rate Last Dose  . acetaminophen (TYLENOL) tablet 650 mg  650 mg Oral Q6H PRN Arnaldo NatalMichael S Diamond, MD       Or  . acetaminophen (TYLENOL) suppository 650 mg  650 mg Rectal Q6H PRN Arnaldo NatalMichael S Diamond, MD      . amitriptyline (ELAVIL) tablet 20 mg  20 mg Oral QHS Arnaldo NatalMichael S Diamond, MD   20 mg at 11/21/15 2123  . amLODipine (NORVASC) tablet 5 mg  5 mg Oral Daily Arnaldo NatalMichael S Diamond, MD   5 mg at 11/22/15 1000  . digoxin (LANOXIN) tablet 0.25 mg  0.25 mg Oral Daily Arnaldo NatalMichael S Diamond, MD   0.25 mg at 11/22/15 1000  . enoxaparin (LOVENOX) injection 30 mg  30 mg Subcutaneous Q24H Shaune PollackQing Kloie Whiting, MD   30 mg at 11/21/15 2118  . feeding supplement (ENSURE ENLIVE) (ENSURE ENLIVE) liquid 237 mL  237 mL Oral TID WC Arnaldo NatalMichael S Diamond, MD   237 mL at 11/22/15 0800  . levothyroxine (SYNTHROID, LEVOTHROID) tablet 75 mcg  75 mcg Oral QAC breakfast Arnaldo NatalMichael S Diamond, MD   75 mcg at 11/21/15 1108  . lisinopril (PRINIVIL,ZESTRIL) tablet 20 mg  20 mg Oral Daily Arnaldo NatalMichael S Diamond, MD   20 mg at 11/22/15 1000  . metoprolol succinate (TOPROL-XL) 24 hr tablet 25 mg  25 mg Oral QHS Arnaldo NatalMichael S Diamond, MD  25 mg at 11/21/15 2118  . mirtazapine (REMERON) tablet 7.5 mg  7.5 mg Oral QHS Arnaldo Natal, MD   7.5 mg at 11/21/15 2118  . morphine 2 MG/ML injection 1 mg  1 mg Intravenous Q3H PRN Arnaldo Natal, MD   1 mg at 11/22/15 1028  . ondansetron (ZOFRAN) tablet 4 mg  4 mg Oral Q6H PRN Arnaldo Natal, MD       Or  . ondansetron [facility]) injection 4 mg  4 mg Intravenous Q6H PRN Arnaldo Natal, MD      . polyethylene glycol [facility] / GLYCOLAX) packet 17 g  17 g Oral Daily PRN Arnaldo Natal, MD      . senna-docusate (Senokot-S) tablet 2 tablet  2 tablet Oral QHS Arnaldo Natal, MD   2 tablet at 11/21/15 2119     Discharge Medications: Please see discharge summary for a list of discharge medications.  Relevant Imaging Results:  Relevant Lab  Results:   Additional Information  (SSN: [id number])  Haig Prophet, LCSW

## 2015-11-22 NOTE — Progress Notes (Signed)
Pts daughter at bedside. Informed of pt's progress per authorization of DSS. 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

## 2015-11-22 NOTE — Progress Notes (Signed)
PT Cancellation Note  Patient Details Name: Jamse MeadMargaret B Rubendall MRN: [id number] DOB: 03/22/1922   Cancelled Treatment:    Reason Eval/Treat Not Completed: Other (comment). Evaluation attempted, however pt unable to participate. Pt very lethargic, unable to open eyes despite cues/tactile rubbing. Upon attempt at adjusting pillows, pt screams out in pain with all tactile touch to B LEs. RN notified, plans to administer morphine. Will re-attempt next date.   Elizabeth PalauStephanie Ray, PT, DPT

## 2015-11-22 NOTE — Progress Notes (Signed)
Patient's daughter presented to the nurses station with the patient's password requesting an update on the patient. I informed daughter that we had a note in the system stating to not give information out to the patient's family per Saintclair Halstedhesha Carr, SW at DSS. Daughter then stated that she received an email from ScottdaleRhesha stating that we could provide her with the requested information. I explained to daughter that I could not accept an email from her phone stating such. I did however call Rhesha call at the number listed in the system to verify this information. Per Saintclair Halstedhesha Carr it is okay to provide daughter only with updates on the patient with the password

## 2015-11-22 NOTE — Clinical Documentation Improvement (Signed)
Internal Medicine  Can the diagnosis of Atrial Fibrillation be further specified by type?  Thank you       Chronic Atrial fibrillation  Paroxysmal Atrial fibrillation  Permanent Atrial fibrillation  Persistent Atrial fibrillation  Other  Clinically Undetermined  Document any associated diagnoses/conditions.   Supporting Information: HTN  Treatment:  Continue home medications    Please exercise your independent, professional judgment when responding. A specific answer is not anticipated or expected.   Thank You,  Lavonda JumboLawanda J Koula Venier Health Information Management [place] [phone]

## 2015-11-23 LAB — BASIC METABOLIC PANEL
ANION GAP: 4 — AB (ref 5–15)
BUN: 33 mg/dL — AB (ref 6–20)
CO2: 31 mmol/L (ref 22–32)
Calcium: 8.6 mg/dL — ABNORMAL LOW (ref 8.9–10.3)
Chloride: 105 mmol/L (ref 101–111)
Creatinine, Ser: 0.92 mg/dL (ref 0.44–1.00)
GFR calc Af Amer: >60 mL/min (ref >60)
GFR calc non Af Amer: 53 mL/min — ABNORMAL LOW (ref >60)
GLUCOSE: 113 mg/dL — AB (ref 65–99)
POTASSIUM: 3.6 mmol/L (ref 3.5–5.1)
Sodium: 140 mmol/L (ref 135–145)

## 2015-11-23 LAB — GLUCOSE, CAPILLARY
Glucose-Capillary: 102 mg/dL — ABNORMAL HIGH (ref 65–99)
Glucose-Capillary: 121 mg/dL — ABNORMAL HIGH (ref 65–99)

## 2015-11-23 LAB — DIGOXIN LEVEL: DIGOXIN LVL: 1.9 ng/mL (ref 0.8–2.0)

## 2015-11-23 MED ORDER — ENSURE ENLIVE PO LIQD: 237.0000 mL | Freq: Three times a day (TID) | ORAL | Status: DC

## 2015-11-23 MED ORDER — OXYCODONE-ACETAMINOPHEN 5-325 MG PO TABS: 1.0000 | ORAL_TABLET | Freq: Four times a day (QID) | ORAL | Status: DC | PRN

## 2015-11-23 NOTE — Progress Notes (Signed)
Initial Nutrition Assessment  DOCUMENTATION CODES:   Severe malnutrition in context of chronic illness  INTERVENTION:  -Recommend dysphagia 3 (chopped) diet order secondary to pt with trouble chewing.  -Recommend magic cup BID for added nutrition and change flavor ensure     NUTRITION DIAGNOSIS:   Malnutrition related to chronic illness as evidenced by severe depletion of body fat, severe depletion of muscle mass.    GOAL:   Patient will meet greater than or equal to 90% of their needs    MONITOR:   PO intake, Supplement acceptance  REASON FOR ASSESSMENT:   Malnutrition Screening Tool    ASSESSMENT:     Pt admitted with pelvic fractures, not planning operative treatment at this time. Pt with dementia  Past Medical History  Diagnosis Date  . Hypertension   . Thyroid disease   . Atrial fibrillation (HCC)    Pt unable to tell this writer anything regarding appetite and intake.  Spoke with RN and reports pt ate very little yesterday secondary to lethargy.  Appetite improved this am but needs chopped foods secondary to pt having trouble chewing.   Medications reivewed remeron Labs reviewed: FSBS 121, 102  Nutrition-Focused physical exam completed. Findings are severe fat depletion, mild/moderate to severe muscle depletion, and none edema.      Diet Order:  DIET SOFT Room service appropriate?: Yes with Assist; Fluid consistency:: Thin Diet - low sodium heart healthy  Skin:  Reviewed, no issues  Last BM:  PTA  Height:   Ht Readings from Last 1 Encounters:  11/22/15 5\' 4"  (1.626 m)    Weight: wt encounters reviewed  Wt Readings from Last 1 Encounters:  11/23/15 103 lb 9.9 oz (47 kg)   Wt Readings from Last 10 Encounters:  11/23/15 103 lb 9.9 oz (47 kg)  08/22/15 102 lb 8 oz (46.494 kg)     Ideal Body Weight:     BMI:  Body mass index is 17.78 kg/(m^2).  Estimated Nutritional Needs:   Kcal:  1410-1645 kcals/d.   Protein:  71-82  g/d  Fluid:  1400-166600ml/d  EDUCATION NEEDS:   No education needs identified at this time  Ngozi Alvidrez B. Freida BusmanAllen, RD, LDN [phone] (pager) Weekend/On-Call pager [phone])

## 2015-11-23 NOTE — Progress Notes (Signed)
Report called to [facility]. Pt will transfer via EMS.

## 2015-11-23 NOTE — Care Management (Signed)
[facility] is not open to [facility]. [facility] made [facility]. No further RNCM needs. CSW will provide discharge information to RN.

## 2015-11-23 NOTE — Discharge Instructions (Signed)
Heart healthy diet. °Activity as tolerated. °Fall precaution. °

## 2015-11-23 NOTE — Care Management (Addendum)
[facility] [phone] today. MD text regarding need for HHPT orders and to change discharge order to ALF/other facility. [facility] cannot take patient as they are out of network. If MD orders HHPT then I will send referral to [facility] as the facility has used them in the past. Message left for Pascal LuxBonnie Thompson at [facility] to confirm PT agency. Per Kendal [facility]ymenBonnie at [facility] patient may already be open to [facility]. This RNCM is checking with Dorene SorrowJerry with [facility].

## 2015-11-23 NOTE — NC FL2 (Signed)
[place] MEDICAID FL2 LEVEL OF CARE SCREENING TOOL     IDENTIFICATION  Patient Name: Melanie Tanner Birthdate: 1922-01-16 Sex: female Admission Date (Current Location): 11/21/2015  [facility] and [place] Number:  Chiropodist and Address:  [facility], [address], [place], [place] [address]      Provider Number: [id number]  Attending Physician Name and Address:  Shaune Pollack, MD  Relative Name and Phone Number:       Current Level of Care: Hospital Recommended Level of Care: Skilled Nursing Facility Prior Approval Number:    Date Approved/Denied:   PASRR Number:  ([id number])  Discharge Plan: Domiciliary (Rest home)    Current Diagnoses: Primary: Dementia  Patient Active Problem List   Diagnosis Date Noted  . Multiple pelvic fractures (HCC) 11/21/2015  . Dehydration 08/22/2015    Orientation RESPIRATION BLADDER Height & Weight     Self  Normal Incontinent Weight: 103 lb 9.9 oz (47 kg) Height:   (162.6 cm)  BEHAVIORAL SYMPTOMS/MOOD NEUROLOGICAL BOWEL NUTRITION STATUS   (none )  (none ) Continent Diet (Diet: Soft )  AMBULATORY STATUS COMMUNICATION OF NEEDS Skin   Limited Assist Verbally Normal                       Personal Care Assistance Level of Assistance  Bathing, Feeding, Dressing Bathing Assistance: Limited assistance Feeding assistance: Limited assistance Dressing Assistance: Limited assistance     Functional Limitations Info  Sight, Hearing, Speech Sight Info: Adequate Hearing Info: Adequate Speech Info: Adequate    SPECIAL CARE FACTORS FREQUENCY  PT (By licensed PT)     PT Frequency:  (Home Health PT via [facility] 2-3 days per week )              Contractures      Additional Factors Info  Code Status, Allergies Code Status Info:  (DNR. ) Allergies Info:  (No Known Allergies. )          Discharge Medications: Please see discharge summary for a list of discharge  medications. DISCHARGE MEDICATIONS:   Current Discharge Medication List    START taking these medications   Details  oxyCODONE-acetaminophen (PERCOCET/ROXICET) 5-325 MG tablet Take 1 tablet by mouth every 6 (six) hours as needed for moderate pain. Qty: 16 tablet, Refills: 0      CONTINUE these medications which have NOT CHANGED   Details  acetaminophen (TYLENOL) 325 MG tablet Take 650 mg by mouth every 8 (eight) hours as needed for mild pain, moderate pain or fever.    amitriptyline (ELAVIL) 10 MG tablet Take 20 mg by mouth at bedtime.    amLODipine (NORVASC) 5 MG tablet Take 1 tablet by mouth daily. Refills: 0    digoxin (LANOXIN) 0.25 MG tablet Take 0.25 mg by mouth daily.    feeding supplement, ENSURE ENLIVE, (ENSURE ENLIVE) LIQD Take 237 mLs by mouth 3 (three) times daily with meals. Qty: 237 mL, Refills: 12    levothyroxine (SYNTHROID, LEVOTHROID) 75 MCG tablet Take 1 tablet by mouth daily. Refills: 0    lisinopril (PRINIVIL,ZESTRIL) 20 MG tablet Take 1 tablet by mouth daily. Refills: 0    metoprolol succinate (TOPROL-XL) 25 MG 24 hr tablet Take 1 tablet (25 mg total) by mouth at bedtime. Qty: 30 tablet, Refills: 0    mirtazapine (REMERON) 7.5 MG tablet Take 7.5 mg by mouth at bedtime.    polyethylene glycol (MIRALAX / GLYCOLAX) packet Take 17 g by mouth  daily as needed for mild constipation.    senna-docusate (SENOKOT-S) 8.6-50 MG tablet Take 2 tablets by mouth at bedtime.      STOP taking these medications     QUEtiapine (SEROQUEL) 25 MG tablet        Relevant Imaging Results:  Relevant Lab Results:   Additional Information  (SSN: [id number])  Haig ProphetMorgan, Bailey G, LCSW

## 2015-11-23 NOTE — Evaluation (Signed)
Physical Therapy Evaluation Patient Details Name: Melanie Tanner MRN: [id number] DOB: 09/03/1921 Today's Date: 11/23/2015   History of Present Illness  Pt admitted for B pelvic fractures. Pt is WBAT per ortho note  Clinical Impression  Pt is a pleasant 82 year old female who was admitted for B pelvic fractures. Pt performs bed mobility with max assist +2 and transfers with mod assist +2 and rw. Pt only able to tolerate standing for brief period prior to fatigue. Pt demonstrates deficits with strength/mobility/endurance. Would benefit from skilled PT to address above deficits and promote optimal return to PLOF. Pt limited secondary to confusion and is very painful with all movement. Pt requires heavy cues for participation; would not make a good rehab candidate.      Follow Up Recommendations Home health PT;Supervision/Assistance - 24 hour    Equipment Recommendations       Recommendations for Other Services       Precautions / Restrictions Precautions Precautions: Fall Restrictions Weight Bearing Restrictions: No      Mobility  Bed Mobility Overal bed mobility: Needs Assistance;+2 for physical assistance Bed Mobility: Supine to Sit     Supine to sit: Max assist;+2 for safety/equipment;+2 for physical assistance     General bed mobility comments: Pt able to initiate movement with B UE holding onto railing, however takes max assist for trunk mobility and B LE for completion of transfer. Once seated at EOB, pt able to maintain sitting with cga.  Transfers Overall transfer level: Needs assistance Equipment used: Rolling walker (2 wheeled) Transfers: Sit to/from Stand Sit to Stand: Mod assist;+2 physical assistance         General transfer comment: transfers performed with + 2 assist and cues for sequencing. Pt very fearful of movement and only able to stand for approx 10-15 seconds before needing to sit down secondary to pain. Unable to further progress  mobility.  Ambulation/Gait                Stairs            Wheelchair Mobility    Modified Rankin (Stroke Patients Only)       Balance Overall balance assessment: Needs assistance Sitting-balance support: Feet supported Sitting balance-Leahy Scale: Good     Standing balance support: Bilateral upper extremity supported Standing balance-Leahy Scale: Fair                               Pertinent Vitals/Pain Pain Assessment: Faces Faces Pain Scale: Hurts whole lot Pain Location: B hips Pain Descriptors / Indicators: Constant;Discomfort;Grimacing Pain Intervention(s): Limited activity within patient's tolerance;Premedicated before session    Home Living Family/patient expects to be discharged to:: Assisted living               Home Equipment: Gilmer MorCane - single point;Walker - 2 wheels      Prior Function Level of Independence: Needs assistance         Comments: Per chart review, pt prefers to stay in bed, however will perform minimal ambulation at [facility].      Hand Dominance        Extremity/Trunk Assessment   Upper Extremity Assessment: Generalized weakness (grossly B 4/5)           Lower Extremity Assessment: Generalized weakness (B LE grossly 3/5 and painful)         Communication   Communication: No difficulties  Cognition Arousal/Alertness: Awake/alert Behavior  During Therapy: WFL for tasks assessed/performed Overall Cognitive Status: History of cognitive impairments - at baseline                      General Comments      Exercises Other Exercises Other Exercises: B LE supine ther-ex performed including ankle pumps, quad sets, hip abd/ad, and SLRs. All ther-ex performed with mod/max assist x 10 reps. Pt moans out in pain with hip abd/add only able to tolerate limited ROM.      Assessment/Plan    PT Assessment Patient needs continued PT services  PT Diagnosis Difficulty walking;Generalized  weakness;Acute pain   PT Problem List Decreased strength;Decreased mobility;Pain  PT Treatment Interventions DME instruction;Gait training;Therapeutic exercise   PT Goals (Current goals can be found in the Care Plan section) Acute Rehab PT Goals Patient Stated Goal: unable to state secondary to confusion PT Goal Formulation: Patient unable to participate in goal setting Time For Goal Achievement: 12/07/15 Potential to Achieve Goals: Fair    Frequency 7X/week   Barriers to discharge        Co-evaluation               End of Session Equipment Utilized During Treatment: Gait belt Activity Tolerance: Patient limited by pain Patient left: in bed;with bed alarm set Nurse Communication: Mobility status         Time: 1610-9604 PT Time Calculation (min) (ACUTE ONLY): 21 min   Charges:   PT Evaluation $PT Eval Moderate Complexity: 1 Procedure PT Treatments $Therapeutic Exercise: 8-22 mins   PT G Codes:        Hildy Nicholl December 22, 2015, 3:10 PM  Elizabeth Palau, PT, DPT [phone]

## 2015-11-23 NOTE — Progress Notes (Signed)
Patient is medically stable for D/C back to [facility] today. Per [facility] administrator patient can return today via EMS. Clinical Education officer, museum (CSW) sent D/C Summary and FL2 to [employer]. RN will arrange EMS for transport. CSW met with patient and her son Shanon Brow at bedside and made them aware of above. CSW also contacted patient's DSS guardian Rhesa Robina Ade and made her aware of above. CSW sent guardian D/C summary. Please reconsult if future social work needs arise. CSW signing off.   Blima Rich, LCSW [phone]

## 2015-11-23 NOTE — Discharge Summary (Addendum)
[facility] - [place] [facility]   PATIENT NAME: Melanie Tanner    MR#:  [id number]  DATE OF BIRTH:  1922-01-10  DATE OF ADMISSION:  11/21/2015 ADMITTING PHYSICIAN: Arnaldo Natal, MD  DATE OF DISCHARGE: 11/23/2015 PRIMARY CARE PHYSICIAN: No primary care provider on file.    ADMISSION DIAGNOSIS:  DNR (do not resuscitate) [Z66] Elevated troponin I level [R79.89] Intractable pain [R52] Chronic dementia, without behavioral disturbance [F03.90] Closed fracture of right inferior pubic ramus, initial encounter (HCC) [S32.591A] Closed fracture of right superior pubic ramus, initial encounter (HCC) [S32.511A] Closed fracture of left inferior pubic ramus, initial encounter (HCC) [S32.592A] Closed fracture of left superior pubic ramus, initial encounter (HCC) [S32.512A]   DISCHARGE DIAGNOSIS:  Multiple closed fractures of the pelvis.  Dehydration SECONDARY DIAGNOSIS:   Past Medical History  Diagnosis Date  . Hypertension   . Thyroid disease   . Atrial fibrillation [facility])     HOSPITAL COURSE:   This is a 82 year old female admitted for pelvic fracture.  1. Pelvic fracture: Multiple closed fractures of the pelvis.  Orthopedic surgery consult, per Dr. Rosita Kea, multiple pelvic fractures with nonoperative treatment planned, she should go get up with physical therapy with adequate pain medication.  2. Hypertension: Controlled; continue amlodipine and lisinopril  3. Chronic Atrial fibrillation: Rate controlled; continue digoxin and metoprolol  4. Hypothyroidism: Continue Synthroid  5. Dementia: continue Elavil and Seroquel, aspiration and fall precaution.  6. DVT prophylaxis: Heparin.  Dehydration. Improving with NS iv. Poor oral intake and malnutrition, better oral intake. Heart healthy diet per dietitian consult.  DISCHARGE CONDITIONS:   Stable, discharge to ALF with HHPT today.  CONSULTS OBTAINED:  Treatment Team:  Kennedy Bucker, MD  DRUG  ALLERGIES:  No Known Allergies  DISCHARGE MEDICATIONS:   Current Discharge Medication List    START taking these medications   Details  oxyCODONE-acetaminophen (PERCOCET/ROXICET) 5-325 MG tablet Take 1 tablet by mouth every 6 (six) hours as needed for moderate pain. Qty: 16 tablet, Refills: 0      CONTINUE these medications which have NOT CHANGED   Details  acetaminophen (TYLENOL) 325 MG tablet Take 650 mg by mouth every 8 (eight) hours as needed for mild pain, moderate pain or fever.    amitriptyline (ELAVIL) 10 MG tablet Take 20 mg by mouth at bedtime.    amLODipine (NORVASC) 5 MG tablet Take 1 tablet by mouth daily. Refills: 0    digoxin (LANOXIN) 0.25 MG tablet Take 0.25 mg by mouth daily.    feeding supplement, ENSURE ENLIVE, (ENSURE ENLIVE) LIQD Take 237 mLs by mouth 3 (three) times daily with meals. Qty: 237 mL, Refills: 12    levothyroxine (SYNTHROID, LEVOTHROID) 75 MCG tablet Take 1 tablet by mouth daily. Refills: 0    lisinopril (PRINIVIL,ZESTRIL) 20 MG tablet Take 1 tablet by mouth daily. Refills: 0    metoprolol succinate (TOPROL-XL) 25 MG 24 hr tablet Take 1 tablet (25 mg total) by mouth at bedtime. Qty: 30 tablet, Refills: 0    mirtazapine (REMERON) 7.5 MG tablet Take 7.5 mg by mouth at bedtime.    polyethylene glycol (MIRALAX / GLYCOLAX) packet Take 17 g by mouth daily as needed for mild constipation.    senna-docusate (SENOKOT-S) 8.6-50 MG tablet Take 2 tablets by mouth at bedtime.      STOP taking these medications     QUEtiapine (SEROQUEL) 25 MG tablet          DISCHARGE INSTRUCTIONS:   If you experience worsening  of your admission symptoms, develop shortness of breath, life threatening emergency, suicidal or homicidal thoughts you must seek medical attention immediately by calling 911 or calling your MD immediately  if symptoms less severe.  You Must read complete instructions/literature along with all the possible adverse reactions/side  effects for all the Medicines you take and that have been prescribed to you. Take any new Medicines after you have completely understood and accept all the possible adverse reactions/side effects.   Please note  You were cared for by a hospitalist during your hospital stay. If you have any questions about your discharge medications or the care you received while you were in the hospital after you are discharged, you can call the unit and asked to speak with the hospitalist on call if the hospitalist that took care of you is not available. Once you are discharged, your primary care physician will handle any further medical issues. Please note that NO REFILLS for any discharge medications will be authorized once you are discharged, as it is imperative that you return to your primary care physician (or establish a relationship with a primary care physician if you do not have one) for your aftercare needs so that they can reassess your need for medications and monitor your lab values.    Today   SUBJECTIVE   No complaint. Pt is demented.   VITAL SIGNS:  Blood pressure 138/58, pulse 63, temperature 96.4 F (35.8 C), temperature source Oral, resp. rate 20, height  (1.626 m), weight 47 kg (103 lb 9.9 oz), SpO2 92 %.  I/O:   Intake/Output Summary (Last 24 hours) at 11/23/15 1317 Last data filed at 11/23/15 0900  Gross per 24 hour  Intake   1220 ml  Output      0 ml  Net   1220 ml    PHYSICAL EXAMINATION:  GENERAL:  82 y.o.-year-old patient lying in the bed with no acute distress. Thin. EYES: Pupils equal, round, reactive to light and accommodation. No scleral icterus. Extraocular muscles intact.  HEENT: Head atraumatic, normocephalic. Oropharynx and nasopharynx clear.  NECK:  Supple, no jugular venous distention. No thyroid enlargement, no tenderness.  LUNGS: Normal breath sounds bilaterally, no wheezing, rales,rhonchi or crepitation. No use of accessory muscles of respiration.   CARDIOVASCULAR: S1, S2 normal. No murmurs, rubs, or gallops.  ABDOMEN: Soft, non-tender, non-distended. Bowel sounds present. No organomegaly or mass.  EXTREMITIES: No pedal edema, cyanosis, or clubbing.  NEUROLOGIC: Cranial nerves II through XII are intact. Muscle strength 3-4/5 in all extremities. Sensation intact. Gait not checked.  PSYCHIATRIC: The patient is demented. SKIN: No obvious rash, lesion, or ulcer.   DATA REVIEW:   CBC  Recent Labs Lab 11/22/15 0344  WBC 7.8  HGB 10.2*  HCT 30.5*  PLT 91*    Chemistries   Recent Labs Lab 11/21/15 0443  11/23/15 0330  NA 136  < > 140  K 3.8  < > 3.6  CL 98*  < > 105  CO2 30  < > 31  GLUCOSE 130*  < > 113*  BUN 33*  < > 33*  CREATININE 0.96  < > 0.92  CALCIUM 9.8  < > 8.6*  MG 1.9  --   --   AST 32  --   --   ALT 23  --   --   ALKPHOS 79  --   --   BILITOT 1.2  --   --   < > = values in this interval  not displayed.  Cardiac Enzymes  Recent Labs Lab 11/21/15 0443  TROPONINI 0.08*    Microbiology Results  Results for orders placed or performed during the hospital encounter of 11/21/15  Urine culture     Status: None   Collection Time: 11/21/15  5:20 AM  Result Value Ref Range Status   Specimen Description URINE, CATHETERIZED  Final   Special Requests Normal  Final   Culture NO GROWTH [facility]   Final   Report Status 11/22/2015 FINAL  Final  MRSA PCR Screening     Status: None   Collection Time: 11/21/15 10:46 AM  Result Value Ref Range Status   MRSA by PCR NEGATIVE NEGATIVE Final    Comment:        The GeneXpert MRSA Assay (FDA approved for NASAL specimens only), is one component of a comprehensive MRSA colonization surveillance program. It is not intended to diagnose MRSA infection nor to guide or monitor treatment for MRSA infections.     RADIOLOGY:  No results found.      Management plans discussed with the patient, family and they are in agreement.  CODE  STATUS:     Code Status Orders        Start     Ordered   11/21/15 0810  Do not attempt resuscitation (DNR)   Continuous    Question Answer Comment  In the event of cardiac or respiratory ARREST Do not call a "code blue"   In the event of cardiac or respiratory ARREST Do not perform Intubation, CPR, defibrillation or ACLS   In the event of cardiac or respiratory ARREST Use medication by any route, position, wound care, and other measures to relive pain and suffering. May use oxygen, suction and manual treatment of airway obstruction as needed for comfort.      11/21/15 0809    Code Status History    Date Active Date Inactive Code Status Order ID Comments User Context   11/21/2015  5:33 AM 11/21/2015  8:09 AM DNR [id number]  Loleta Roseory Forbach, MD ED   08/22/2015  4:09 AM 08/24/2015  7:06 PM Full Code [id number]  Ihor AustinPavan Pyreddy, MD Inpatient      TOTAL TIME TAKING CARE OF THIS PATIENT: 33 minutes.    Shaune Pollackhen, Tymia Streb M.D on 11/23/2015 at 1:17 PM  Between 7am to 6pm - Pager - [phone]  After 6pm go to [email or web address] - password [email or web address] [facility]  [facility]  Office  [phone]  CC: Primary care physician; No primary care provider on file.

## 2015-11-23 NOTE — Progress Notes (Signed)
Per Dr. Imogene Burnhen, pt does not have to have BM prior to leaving unless required by SNF. Will contact [facility] for more info.

## 2016-01-14 ENCOUNTER — Emergency Department: Payer: Medicare Other

## 2016-01-14 ENCOUNTER — Emergency Department
Admission: EM | Admit: 2016-01-14 | Discharge: 2016-01-14 | Disposition: A | Discharge status: Home / Self Care | Payer: Medicare Other | Attending: Emergency Medicine | Admitting: Emergency Medicine

## 2016-01-14 DIAGNOSIS — Y999 Unspecified external cause status: Secondary | ICD-10-CM | POA: Diagnosis not present

## 2016-01-14 DIAGNOSIS — I10 Essential (primary) hypertension: Secondary | ICD-10-CM | POA: Insufficient documentation

## 2016-01-14 DIAGNOSIS — Y929 Unspecified place or not applicable: Secondary | ICD-10-CM | POA: Diagnosis not present

## 2016-01-14 DIAGNOSIS — Z79899 Other long term (current) drug therapy: Secondary | ICD-10-CM | POA: Diagnosis not present

## 2016-01-14 DIAGNOSIS — F0390 Unspecified dementia without behavioral disturbance: Secondary | ICD-10-CM | POA: Insufficient documentation

## 2016-01-14 DIAGNOSIS — E079 Disorder of thyroid, unspecified: Secondary | ICD-10-CM | POA: Insufficient documentation

## 2016-01-14 DIAGNOSIS — I4891 Unspecified atrial fibrillation: Secondary | ICD-10-CM | POA: Diagnosis not present

## 2016-01-14 DIAGNOSIS — W19XXXA Unspecified fall, initial encounter: Secondary | ICD-10-CM | POA: Insufficient documentation

## 2016-01-14 DIAGNOSIS — Y939 Activity, unspecified: Secondary | ICD-10-CM | POA: Insufficient documentation

## 2016-01-14 DIAGNOSIS — R410 Disorientation, unspecified: Secondary | ICD-10-CM | POA: Diagnosis present

## 2016-01-14 LAB — URINALYSIS COMPLETE WITH MICROSCOPIC (ARMC ONLY)
BACTERIA UA: NONE SEEN
BILIRUBIN URINE: NEGATIVE
GLUCOSE, UA: NEGATIVE mg/dL
KETONES UR: NEGATIVE mg/dL
LEUKOCYTES UA: NEGATIVE
Nitrite: NEGATIVE
Protein, ur: NEGATIVE mg/dL
Specific Gravity, Urine: 1.005 (ref 1.005–1.030)
pH: 7 (ref 5.0–8.0)

## 2016-01-14 LAB — CBC WITH DIFFERENTIAL/PLATELET
BASOS ABS: 0.1 10*3/uL (ref 0–0.1)
Basophils Relative: 1 %
EOS PCT: 2 %
Eosinophils Absolute: 0.1 10*3/uL (ref 0–0.7)
HEMATOCRIT: 32.5 % — AB (ref 35.0–47.0)
Hemoglobin: 11.1 g/dL — ABNORMAL LOW (ref 12.0–16.0)
LYMPHS PCT: 15 %
Lymphs Abs: 0.8 10*3/uL — ABNORMAL LOW (ref 1.0–3.6)
MCH: 32.8 pg (ref 26.0–34.0)
MCHC: 34 g/dL (ref 32.0–36.0)
MCV: 96.6 fL (ref 80.0–100.0)
MONO ABS: 0.6 10*3/uL (ref 0.2–0.9)
MONOS PCT: 11 %
Neutro Abs: 4.1 10*3/uL (ref 1.4–6.5)
Neutrophils Relative %: 71 %
PLATELETS: 152 10*3/uL (ref 150–440)
RBC: 3.37 MIL/uL — ABNORMAL LOW (ref 3.80–5.20)
RDW: 14.4 % (ref 11.5–14.5)
WBC: 5.7 10*3/uL (ref 3.6–11.0)

## 2016-01-14 LAB — BASIC METABOLIC PANEL
Anion gap: 8 (ref 5–15)
BUN: 22 mg/dL — AB (ref 6–20)
CO2: 31 mmol/L (ref 22–32)
Calcium: 9.3 mg/dL (ref 8.9–10.3)
Chloride: 99 mmol/L — ABNORMAL LOW (ref 101–111)
Creatinine, Ser: 0.95 mg/dL (ref 0.44–1.00)
GFR calc Af Amer: 59 mL/min — ABNORMAL LOW (ref >60)
GFR, EST NON AFRICAN AMERICAN: 51 mL/min — AB (ref >60)
GLUCOSE: 103 mg/dL — AB (ref 65–99)
POTASSIUM: 3.6 mmol/L (ref 3.5–5.1)
Sodium: 138 mmol/L (ref 135–145)

## 2016-01-14 MED ORDER — SODIUM CHLORIDE 0.9 % IV BOLUS (SEPSIS)
1000.0000 mL | Freq: Once | INTRAVENOUS | Status: AC
  Administered 2016-01-14: 1000 mL via INTRAVENOUS

## 2016-01-14 NOTE — ED Provider Notes (Signed)
[facility] Emergency Department Provider Note  ____________________________________________  Time seen: 3:45 PM  I have reviewed the triage vital signs and the nursing notes.   HISTORY  Chief Complaint Fall Level 5 caveat:  Portions of the history and physical were unable to be obtained due to the patient's severe dementia    HPI Melanie Tanner is a 82 y.o. female sent to the ED for an unwitnessed fall. She had been in bed sleeping and then when patient returned to check on her she was on the floor. Patient is completely disoriented and unable to relate any symptoms at all. She responds to questions inappropriately. She is also severely hard of hearing and confabulates.     Past Medical History  Diagnosis Date  . Hypertension   . Thyroid disease   . Atrial fibrillation [facility])      Patient Active Problem List   Diagnosis Date Noted  . Multiple pelvic fractures (HCC) 11/21/2015  . Dehydration 08/22/2015     Past Surgical History  Procedure Laterality Date  . Hip surgery       Current Outpatient Rx  Name  Route  Sig  Dispense  Refill  . acetaminophen (TYLENOL) 325 MG tablet   Oral   Take 650 mg by mouth every 8 (eight) hours as needed for mild pain, moderate pain or fever.         Marland Kitchen amitriptyline (ELAVIL) 10 MG tablet   Oral   Take 20 mg by mouth at bedtime.         Marland Kitchen amLODipine (NORVASC) 5 MG tablet   Oral   Take 1 tablet by mouth daily.      0   . digoxin (LANOXIN) 0.25 MG tablet   Oral   Take 0.25 mg by mouth daily.         . feeding supplement, ENSURE ENLIVE, (ENSURE ENLIVE) LIQD   Oral   Take 237 mLs by mouth 3 (three) times daily with meals.   237 mL   12   . levothyroxine (SYNTHROID, LEVOTHROID) 75 MCG tablet   Oral   Take 1 tablet by mouth daily.      0   . lisinopril (PRINIVIL,ZESTRIL) 20 MG tablet   Oral   Take 1 tablet by mouth daily.      0   . metoprolol succinate (TOPROL-XL) 25 MG 24 hr tablet    Oral   Take 1 tablet (25 mg total) by mouth at bedtime. Patient taking differently: Take 25 mg by mouth daily.    30 tablet   0   . mirtazapine (REMERON) 7.5 MG tablet   Oral   Take 7.5 mg by mouth at bedtime.         Marland Kitchen oxyCODONE-acetaminophen (PERCOCET/ROXICET) 5-325 MG tablet   Oral   Take 1 tablet by mouth every 6 (six) hours as needed for moderate pain.   16 tablet   0   . polyethylene glycol (MIRALAX / GLYCOLAX) packet   Oral   Take 17 g by mouth daily as needed for mild constipation.         . senna-docusate (SENOKOT-S) 8.6-50 MG tablet   Oral   Take 2 tablets by mouth at bedtime.            Allergies Review of patient's allergies indicates no known allergies.   Family History  Problem Relation Age of Onset  . Hypertension Son     Social History Social History  Substance Use  Topics  . Smoking status: Never Smoker   . Smokeless tobacco: None  . Alcohol Use: No    Review of Systems  Unable to obtain  ____________________________________________   PHYSICAL EXAM:  VITAL SIGNS: ED Triage Vitals  Enc Vitals Group     BP 01/14/16 1436 178/61 mmHg     Pulse Rate 01/14/16 1436 66     Resp 01/14/16 1436 18     Temp 01/14/16 1436 97.5 F (36.4 C)     Temp Source 01/14/16 1436 Oral     SpO2 01/14/16 1436 98 %     Weight 01/14/16 1436 105 lb (47.628 kg)     Height --      Head Cir --      Peak Flow --      Pain Score --      Pain Loc --      Pain Edu? --      Excl. in GC? --     Vital signs reviewed, nursing assessments reviewed.   Constitutional:   Alert and orientedTo self. Well appearing and in no distress. Eyes:   No scleral icterus. No conjunctival pallor. PERRL. EOMI.  No nystagmus. ENT   Head:   Normocephalic and atraumatic.   Nose:   No congestion/rhinnorhea. No septal hematoma   Mouth/Throat:   Dry mucous membranes, no pharyngeal erythema. No peritonsillar mass.    Neck:   No stridor. No SubQ emphysema. No  meningismus. Hematological/Lymphatic/Immunilogical:   No cervical lymphadenopathy. Cardiovascular:   RRR. Symmetric bilateral radial and DP pulses.  No murmurs.  Respiratory:   Normal respiratory effort without tachypnea nor retractions. Breath sounds are clear and equal bilaterally. No wheezes/rales/rhonchi. Gastrointestinal:   Soft and nontender. Non distended. There is no CVA tenderness.  No rebound, rigidity, or guarding. Genitourinary:   deferred Musculoskeletal:  Bilateral hip temp tenderness right greater than left. Intact range of motion. Other exam is unremarkable without any deformities. No leg shortening or abnormal rotation. Neurologic:   Confabulatory speech. Cranial nerves II through X intact. Motor symmetric and intact. Skin:    Skin is warm, dry and intact. No rash noted.  No petechiae, purpura, or bullae.  ____________________________________________    LABS (pertinent positives/negatives) (all labs ordered are listed, but only abnormal results are displayed) Labs Reviewed  BASIC METABOLIC PANEL - Abnormal; Notable for the following:    Chloride 99 (*)    Glucose, Bld 103 (*)    BUN 22 (*)    GFR calc non Af Amer 51 (*)    GFR calc Af Amer 59 (*)    All other components within normal limits  CBC WITH DIFFERENTIAL/PLATELET - Abnormal; Notable for the following:    RBC 3.37 (*)    Hemoglobin 11.1 (*)    HCT 32.5 (*)    Lymphs Abs 0.8 (*)    All other components within normal limits  URINALYSIS COMPLETEWITH MICROSCOPIC ([facility] ONLY) - Abnormal; Notable for the following:    Color, Urine COLORLESS (*)    APPearance CLEAR (*)    Hgb urine dipstick 1+ (*)    Squamous Epithelial / LPF 0-5 (*)    All other components within normal limits  URINE CULTURE   ____________________________________________   EKG  Interpreted by me Sinus rhythm rate of 67, normal axis and intervals. Poor R-wave progression in anterior peripheral leads. Normal ST segments and T  waves.  ____________________________________________    RADIOLOGY  CT head unremarkable X-ray pelvis and hip unremarkable  ____________________________________________  PROCEDURES   ____________________________________________   INITIAL IMPRESSION / ASSESSMENT AND PLAN / ED COURSE  Pertinent labs & imaging results that were available during my care of the patient were reviewed by me and considered in my medical decision making (see chart for details).  Check CT had an x-ray hip. Check labs. IV fluids. Reassessed.   ----------------------------------------- 6:40 PM on 01/14/2016 -----------------------------------------  Vital signs are main stable during observation. In the emergency department. Workup is unremarkable. We'll discharge home follow up with primary care. No acute traumatic injuries. No apparent inciting event, no urinary tract infection. Low suspicion for pneumonia sepsis meningitis encephalitis or stroke. No evidence of ACS PE or dissection. Or AAA    ____________________________________________   FINAL CLINICAL IMPRESSION(S) / ED DIAGNOSES  Final diagnoses:  Fall, initial encounter  Chronic dementia, without behavioral disturbance       Portions of this note were generated with dragon dictation software. Dictation errors may occur despite best attempts at proofreading.   Sharman Cheek, MD 01/14/16 1840

## 2016-01-14 NOTE — ED Notes (Signed)
Pt CBG checked with [facility], 116

## 2016-01-14 NOTE — ED Notes (Signed)
Report to Nellie, RN 

## 2016-01-14 NOTE — ED Notes (Signed)
Pt arrives to ER via [facility] from [facility] after unwitnessed fall. Pt was sleeping, when staff returned patient was in floor. No obvious deformities or complaints of pain from patient. Pt does lean to left side when sitting in bed. Pt alert to self, disoriented to time place and situation.

## 2016-01-14 NOTE — Discharge Instructions (Signed)
Dementia Dementia is a general term for problems with brain function. A person with dementia has memory loss and a hard time with at least one other brain function such as thinking, speaking, or problem solving. Dementia can affect social functioning, how you do your job, your mood, or your personality. The changes may be hidden for a long time. The earliest forms of this disease are usually not detected by family or friends. Dementia can be:  Irreversible.  Potentially reversible.  Partially reversible.  Progressive. This means it can get worse over time. CAUSES  Irreversible dementia causes may include:  Degeneration of brain cells (Alzheimer disease or Lewy body dementia).  Multiple small strokes (vascular dementia).  Infection (chronic meningitis or Creutzfeldt-Jakob disease).  Frontotemporal dementia. This affects younger people, age 87 to 63, compared to those who have Alzheimer disease.  Dementia associated with other disorders like Parkinson disease, Huntington disease, or HIV-associated dementia. Potentially or partially reversible dementia causes may include:  Medicines.  Metabolic causes such as excessive alcohol intake, vitamin B12 deficiency, or thyroid disease.  Masses or pressure in the brain such as a tumor, blood clot, or hydrocephalus. SIGNS AND SYMPTOMS  Symptoms are often hard to detect. Family members or coworkers may not notice them early in the disease process. Different people with dementia may have different symptoms. Symptoms can include:  A hard time with memory, especially recent memory. Long-term memory may not be impaired.  Asking the same question multiple times or forgetting something someone just said.  A hard time speaking your thoughts or finding certain words.  A hard time solving problems or performing familiar tasks (such as how to use a telephone).  Sudden changes in mood.  Changes in personality, especially increasing moodiness or  mistrust.  Depression.  A hard time understanding complex ideas that were never a problem in the past. DIAGNOSIS  There are no specific tests for dementia.   Your health care provider may recommend a thorough evaluation. This is because some forms of dementia can be reversible. The evaluation will likely include a physical exam and getting a detailed history from you and a family member. The history often gives the best clues and suggestions for a diagnosis.  Memory testing may be done. A detailed brain function evaluation called neuropsychologic testing may be helpful.  Lab tests and brain imaging (such as a CT scan or MRI scan) are sometimes important.  Sometimes observation and re-evaluation over time is very helpful. TREATMENT  Treatment depends on the cause.   If the problem is a vitamin deficiency, it may be helped or cured with supplements.  For dementias such as Alzheimer disease, medicines are available to stabilize or slow the course of the disease. There are no cures for this type of dementia.  Your health care provider can help direct you to groups, organizations, and other health care providers to help with decisions in the care of you or your loved one. HOME CARE INSTRUCTIONS The care of individuals with dementia is varied and dependent upon the progression of the dementia. The following suggestions are intended for the person living with, or caring for, the person with dementia.  Create a safe environment.  Remove the locks on bathroom doors to prevent the person from accidentally locking himself or herself in.  Use childproof latches on kitchen cabinets and any place where cleaning supplies, chemicals, or alcohol are kept.  Use childproof covers in unused electrical outlets.  Install childproof devices to keep doors and windows  secured.  Remove stove knobs or install safety knobs and an automatic shut-off on the stove.  Lower the temperature on water  heaters.  Label medicines and keep them locked up.  Secure knives, lighters, matches, power tools, and guns, and keep these items out of reach.  Keep the house free from clutter. Remove rugs or anything that might contribute to a fall.  Remove objects that might break and hurt the person.  Make sure lighting is good, both inside and outside.  Install grab rails as needed.  Use a monitoring device to alert you to falls or other needs for help.  Reduce confusion.  Keep familiar objects and people around.  Use night lights or dim lights at night.  Label items or areas.  Use reminders, notes, or directions for daily activities or tasks.  Keep a simple, consistent routine for waking, meals, bathing, dressing, and bedtime.  Create a calm, quiet environment.  Place large clocks and calendars prominently.  Display emergency numbers and home address near all telephones.  Use cues to establish different times of the day. An example is to open curtains to let the natural light in during the day.   Use effective communication.  Choose simple words and short sentences.  Use a gentle, calm tone of voice.  Be careful not to interrupt.  If the person is struggling to find a word or communicate a thought, try to provide the word or thought.  Ask one question at a time. Allow the person ample time to answer questions. Repeat the question again if the person does not respond.  Reduce nighttime restlessness.  Provide a comfortable bed.  Have a consistent nighttime routine.  Ensure a regular walking or physical activity schedule. Involve the person in daily activities as much as possible.  Limit napping during the day.  Limit caffeine.  Attend social events that stimulate rather than overwhelm the senses.  Encourage good nutrition and hydration.  Reduce distractions during meal times and snacks.  Avoid foods that are too hot or too cold.  Monitor chewing and swallowing  ability.  Continue with routine vision, hearing, dental, and medical screenings.  Give medicines only as directed by the health care provider.  Monitor driving abilities. Do not allow the person to drive when safe driving is no longer possible.  Register with an identification program which could provide location assistance in the event of a missing person situation. SEEK MEDICAL CARE IF:   New behavioral problems start such as moodiness, aggressiveness, or seeing things that are not there (hallucinations).  Any new problem with brain function happens. This includes problems with balance, speech, or falling a lot.  Problems with swallowing develop.  Any symptoms of other illness happen. Small changes or worsening in any aspect of brain function can be a sign that the illness is getting worse. It can also be a sign of another medical illness such as infection. Seeing a health care provider right away is important. SEEK IMMEDIATE MEDICAL CARE IF:   A fever develops.  New or worsened confusion develops.  New or worsened sleepiness develops.  Staying awake becomes hard to do.   This information is not intended to replace advice given to you by your health care provider. Make sure you discuss any questions you have with your health care provider.   Document Released: 12/13/2000 Document Revised: 07/10/2014 Document Reviewed: 11/14/2010 Elsevier Interactive Patient Education 2016 [employer]  Simple Pelvic Fracture, Adult A pelvic fracture is a break  in one of the pelvic bones. The pelvic bones include the bones that you sit on and the bones that make up the lower part of your spine. A pelvic fracture is called simple if the broken bones are stable and are not moving out of place. CAUSES  Common causes of this type of fracture include:  A fall.  A car accident.  Force or pressure applied to the pelvis. RISK FACTORS You may be at higher risk for this type of fracture  if:  You play high-impact sports.  You are an older person with a condition that causes weak bones (osteoporosis).  You have a bone-weakening disease. SIGNS AND SYMPTOMS Signs and symptoms may include:  Tenderness, swelling, or bruising in the affected area.  Pain when moving the hip.  Pain when walking or standing. DIAGNOSIS A diagnosis is made with a physical exam and X-rays. Sometimes, a CT scan is also done. TREATMENT The goal of treatment is to get the bones to heal in a good position. Treatment of a simple pelvic fracture usually involves staying in bed (bed rest) and using crutches or a walker until the bones heal. Medicines may be prescribed for pain. Medicines may also be prescribed that help to prevent blood clots from forming in the legs. HOME CARE INSTRUCTIONS Managing Pain, Stiffness, and Swelling  If directed, apply ice to the injured area:  Put ice in a plastic bag.  Place a towel between your skin and the bag.  Leave the ice on for 20 minutes, 2-3 times a day.  Raise the injured area above the level of your heart while you are sitting or lying down. Driving  Do not  drive or operate heavy machinery until your health care provider tells you it is safe to do. Activity  Stay on bed rest for as long as directed by your health care provider.  While on bed rest:  Change the position of your legs every 1-2 hours. This keeps blood moving well through both of your legs.  You may sit for as long as you feel comfortable.  After bed rest:  Avoid strenuous activities for as long as directed by your health care provider.  Return to your normal activities as directed by your health care provider. Ask your health care provider what activities are safe for you. Safety  Do not use the injured limb to support your body weight until your health care provider says that you can. Use crutches or a walker as directed by your health care provider. General Instructions  Do  not use any tobacco products, including cigarettes, chewing tobacco, or electronic cigarettes. Tobacco can delay bone healing. If you need help quitting, ask your health care provider.  Take medicines only as directed by your health care provider.  Keep all follow-up visits as directed by your health care provider. This is important. SEEK MEDICAL CARE IF:  Your pain gets worse.  Your pain is not relieved with medicines. SEEK IMMEDIATE MEDICAL CARE IF:  You feel light-headed or faint.  You develop chest pain.  You develop shortness of breath.  You have a fever.  You have blood in your urine or your stools.  You have vaginal bleeding.  You have difficulty or pain with urination or with a bowel movement.  You have difficulty or increased pain with walking.  You have new or increased swelling in one of your legs.  You have numbness in your legs or groin area.   This information is  not intended to replace advice given to you by your health care provider. Make sure you discuss any questions you have with your health care provider.   Document Released: 08/28/2001 Document Revised: 07/10/2014 Document Reviewed: 02/10/2014 Elsevier Interactive Patient Education [employer]

## 2016-01-14 NOTE — ED Notes (Addendum)
[facility]/ [facility] from [facility] [place]. Stated that pt would need EMS transport.

## 2016-01-16 LAB — URINE CULTURE: Culture: <10000 — AB

## 2016-01-25 ENCOUNTER — Emergency Department: Payer: Medicare Other

## 2016-01-25 ENCOUNTER — Emergency Department
Admission: EM | Admit: 2016-01-25 | Discharge: 2016-01-25 | Disposition: A | Discharge status: Home / Self Care | Payer: Medicare Other | Attending: Emergency Medicine | Admitting: Emergency Medicine

## 2016-01-25 DIAGNOSIS — W050XXA Fall from non-moving wheelchair, initial encounter: Secondary | ICD-10-CM | POA: Insufficient documentation

## 2016-01-25 DIAGNOSIS — F0390 Unspecified dementia without behavioral disturbance: Secondary | ICD-10-CM | POA: Diagnosis not present

## 2016-01-25 DIAGNOSIS — S0101XA Laceration without foreign body of scalp, initial encounter: Secondary | ICD-10-CM | POA: Diagnosis present

## 2016-01-25 DIAGNOSIS — Y929 Unspecified place or not applicable: Secondary | ICD-10-CM | POA: Insufficient documentation

## 2016-01-25 DIAGNOSIS — Y939 Activity, unspecified: Secondary | ICD-10-CM | POA: Insufficient documentation

## 2016-01-25 DIAGNOSIS — Y999 Unspecified external cause status: Secondary | ICD-10-CM | POA: Insufficient documentation

## 2016-01-25 DIAGNOSIS — Z79899 Other long term (current) drug therapy: Secondary | ICD-10-CM | POA: Insufficient documentation

## 2016-01-25 DIAGNOSIS — I4891 Unspecified atrial fibrillation: Secondary | ICD-10-CM | POA: Insufficient documentation

## 2016-01-25 DIAGNOSIS — W1809XA Striking against other object with subsequent fall, initial encounter: Secondary | ICD-10-CM | POA: Insufficient documentation

## 2016-01-25 DIAGNOSIS — I10 Essential (primary) hypertension: Secondary | ICD-10-CM | POA: Insufficient documentation

## 2016-01-25 DIAGNOSIS — W19XXXA Unspecified fall, initial encounter: Secondary | ICD-10-CM

## 2016-01-25 HISTORY — DX: Unspecified dementia, unspecified severity, without behavioral disturbance, psychotic disturbance, mood disturbance, and anxiety: F03.90

## 2016-01-25 LAB — BASIC METABOLIC PANEL
Anion gap: 9 (ref 5–15)
BUN: 31 mg/dL — AB (ref 6–20)
CHLORIDE: 102 mmol/L (ref 101–111)
CO2: 29 mmol/L (ref 22–32)
CREATININE: 1.08 mg/dL — AB (ref 0.44–1.00)
Calcium: 9.4 mg/dL (ref 8.9–10.3)
GFR calc Af Amer: 50 mL/min — ABNORMAL LOW (ref >60)
GFR calc non Af Amer: 44 mL/min — ABNORMAL LOW (ref >60)
GLUCOSE: 109 mg/dL — AB (ref 65–99)
Potassium: 3.9 mmol/L (ref 3.5–5.1)
SODIUM: 140 mmol/L (ref 135–145)

## 2016-01-25 LAB — CBC
HCT: 33.2 % — ABNORMAL LOW (ref 35.0–47.0)
HEMOGLOBIN: 11.1 g/dL — AB (ref 12.0–16.0)
MCH: 32.4 pg (ref 26.0–34.0)
MCHC: 33.4 g/dL (ref 32.0–36.0)
MCV: 97.1 fL (ref 80.0–100.0)
Platelets: 140 10*3/uL — ABNORMAL LOW (ref 150–440)
RBC: 3.42 MIL/uL — ABNORMAL LOW (ref 3.80–5.20)
RDW: 13.8 % (ref 11.5–14.5)
WBC: 7.2 10*3/uL (ref 3.6–11.0)

## 2016-01-25 MED ORDER — ACETAMINOPHEN 500 MG PO TABS
1000.0000 mg | ORAL_TABLET | Freq: Once | ORAL | Status: AC
  Administered 2016-01-25: 1000 mg via ORAL

## 2016-01-25 MED ORDER — ACETAMINOPHEN 500 MG PO TABS
ORAL_TABLET | ORAL | Status: AC
  Administered 2016-01-25: 1000 mg via ORAL
  Filled 2016-01-25: qty 2

## 2016-01-25 NOTE — ED Provider Notes (Signed)
[facility] Emergency Department Provider Note  ____________________________________________  Time seen: Approximately 8:18 PM  I have reviewed the triage vital signs and the nursing notes.   HISTORY  Chief Complaint Fall  EM caveat: The patient herself suffers from severe dementia and is unable to provide history.  HPI Melanie Tanner is a 82 y.o. female presents for evaluation of a fall. Per her care worker, the patient was seated in her wheelchair when she leaned forward falling striking the side of her head on a edge of furniture. She is noted to have a cut about the area of her left eye.  Follows from a seated position. There was no loss of consciousness. The patient has reported about acting normally receiving the fall.  Patient is able tolerate that the side of her head feels sore, she denies being in any pain or discomfort other than having some tenderness on her face. She specifically denies any chest pain or shortness of breath.   Past Medical History:  Diagnosis Date  . Atrial fibrillation (HCC)   . Dementia   . Hypertension   . Thyroid disease     Patient Active Problem List   Diagnosis Date Noted  . Multiple pelvic fractures (HCC) 11/21/2015  . Dehydration 08/22/2015    Past Surgical History:  Procedure Laterality Date  . HIP SURGERY      Current Outpatient Rx  . Order #: [id number] Class: Historical Med  . Order #: [id number] Class: Historical Med  . Order #: [id number] Class: Historical Med  . Order #: [id number] Class: Historical Med  . Order #: [id number] Class: Print  . Order #: [id number] Class: Historical Med  . Order #: [id number] Class: Historical Med  . Order #: [id number] Class: Print  . Order #: [id number] Class: Historical Med  . Order #: [id number] Class: Print  . Order #: [id number] Class: Historical Med  . Order #: [id number] Class: Historical Med    Allergies Review of patient's allergies indicates no known allergies.  Family  History  Problem Relation Age of Onset  . Hypertension Son     Social History Social History  Substance Use Topics  . Smoking status: Never Smoker  . Smokeless tobacco: Not on file  . Alcohol use No    Review of Systems Reported to be in her normal state of health    ____________________________________________   PHYSICAL EXAM:  VITAL SIGNS: ED Triage Vitals  Enc Vitals Group     BP 01/25/16 1338 (!) 164/58     Pulse Rate 01/25/16 1338 65     Resp 01/25/16 1338 18     Temp 01/25/16 1338 97.8 F (36.6 C)     Temp src --      SpO2 01/25/16 1338 99 %     Weight 01/25/16 1348 108 lb 3.2 oz (49.1 kg)     Height 01/25/16 1348 5\' 5"  (1.651 m)     Head Circumference --      Peak Flow --      Pain Score 01/25/16 1500 Asleep     Pain Loc --      Pain Edu? --      Excl. in GC? --    Constitutional: Alert and oriented. Well appearing and in no acute distress. Eyes: Conjunctivae are normal. PERRL. EOMI. Head: Atraumatic except for approximately 2 cm linear laceration just lateral to the left lateral canthus. Nose: No congestion/rhinnorhea. Mouth/Throat: Mucous membranes are moist.  Oropharynx non-erythematous. Neck: No stridor.  No midline cervical tenderness noted Cardiovascular: Normal rate, regular rhythm. Grossly  normal heart sounds.  Good peripheral circulation. Respiratory: Normal respiratory effort.  No retractions. Lungs CTAB. Gastrointestinal: Soft and nontender.  Musculoskeletal:   Lower Extremities  No edema. Normal DP/PT pulses bilateral with good cap refill.  Normal neuro-motor function lower extremities bilateral.  RIGHT Right lower extremity demonstrates normal strength, good use of all muscles. No edema bruising or contusions of the right hip, right knee, right ankle. Full range of motion of the right lower extremity without pain. No pain on axial loading. No evidence of trauma.  LEFT Left lower extremity demonstrates normal strength, good use of all  muscles. No edema bruising or contusions of the hip,  knee, ankle. Full range of motion of the left lower extremity without pain. No pain on axial loading. No evidence of trauma.  RIGHT Right upper extremity demonstrates normal strength, good use of all muscles. No edema bruising or contusions of the right shoulder/upper arm, right elbow, right forearm / hand. Full range of motion of the right right upper extremity without pain. No evidence of trauma. Strong radial pulse. Intact median/ulnar/radial neuro-muscular exam.  LEFT Left upper extremity demonstrates normal strength, good use of all muscles. No edema bruising or contusions of the left shoulder/upper arm, left elbow, left forearm / hand. Full range of motion of the left  upper extremity without pain. No evidence of trauma. Strong radial pulse. Intact median/ulnar/radial neuro-muscular exam.   Neurologic:  Normal speech and language. No gross focal neurologic deficits are appreciated.  Skin:  Skin is warm, dry and intact. No rash noted. Psychiatric: Mood and affect are normal. Speech and behavior are normal.  ____________________________________________   LABS (all labs ordered are listed, but only abnormal results are displayed)  Labs Reviewed  CBC - Abnormal; Notable for the following:       Result Value   RBC 3.42 (*)    Hemoglobin 11.1 (*)    HCT 33.2 (*)    Platelets 140 (*)    All other components within normal limits  BASIC METABOLIC PANEL - Abnormal; Notable for the following:    Glucose, Bld 109 (*)    BUN 31 (*)    Creatinine, Ser 1.08 (*)    GFR calc non Af Amer 44 (*)    GFR calc Af Amer 50 (*)    All other components within normal limits   ____________________________________________  EKG  Reviewed injury by me at 1410 Heart rate 70 PR 170 QTc 420 Normal sinus rhythm, nonspecific T-wave abnormality noted laterally, appears to be pre-existing based on previous 12 leads  reviewed ____________________________________________  RADIOLOGY  CT Head Wo Contrast (Final result)  Result time 01/25/16 16:30:45  Final result by Lacy Duverney, MD (01/25/16 16:30:45)           Narrative:   CLINICAL DATA: 82 year old female post fall with head injury. Dementia. Pain all over. Initial encounter. EXAM: CT HEAD WITHOUT CONTRAST CT CERVICAL SPINE WITHOUT CONTRAST TECHNIQUE: Multidetector CT imaging of the head and cervical spine was performed following the standard protocol without intravenous contrast. Multiplanar CT image reconstructions of the cervical spine were also generated. COMPARISON: 01/14/2016 and 08/21/2015 head CT. FINDINGS: CT HEAD FINDINGS No skull fracture or intracranial hemorrhage. Prominent global atrophy without hydrocephalus. Chronic microvascular changes without CT evidence of large acute infarct. No intracranial mass lesion noted on this unenhanced exam. Post right lens replacement without acute orbital abnormality. Visualized mastoid air cells, middle ear cavities and paranasal sinuses are clear. CT CERVICAL SPINE FINDINGS No cervical spine fracture.  Fusion C2 through C4. Facet degenerative changes C4-5 greater on the left. Minimal anterior slip C4. Spinal stenosis and foraminal narrowing greatest at this level. No abnormal prevertebral soft tissue swelling. If ligamentous injury or cord injury were of high clinical concern, MR imaging could be obtained for further delineation. Coarse calcification left lobe of thyroid gland. Carotid bifurcation calcifications. No lung apical mass. IMPRESSION: CT HEAD No skull fracture or intracranial hemorrhage. Prominent global atrophy without hydrocephalus. Chronic microvascular changes without CT evidence of large acute infarct. CT CERVICAL SPINE No cervical spine fracture. Fusion C2 through C4. Facet degenerative changes C4-5 greater on the left. Minimal anterior slip C4. Spinal  stenosis and foraminal narrowing greatest at this level. Electronically Signed By: Lacy Duverney M.D. On: 01/25/2016 16:30            CT Cervical Spine Wo Contrast (Final result)  Result time 01/25/16 16:30:45  Final result by Lacy Duverney, MD (01/25/16 16:30:45)           Narrative:   CLINICAL DATA: 82 year old female post fall with head injury. Dementia. Pain all over. Initial encounter. EXAM: CT HEAD WITHOUT CONTRAST CT CERVICAL SPINE WITHOUT CONTRAST TECHNIQUE: Multidetector CT imaging of the head and cervical spine was performed following the standard protocol without intravenous contrast. Multiplanar CT image reconstructions of the cervical spine were also generated. COMPARISON: 01/14/2016 and 08/21/2015 head CT. FINDINGS: CT HEAD FINDINGS No skull fracture or intracranial hemorrhage. Prominent global atrophy without hydrocephalus. Chronic microvascular changes without CT evidence of large acute infarct. No intracranial mass lesion noted on this unenhanced exam. Post right lens replacement without acute orbital abnormality. Visualized mastoid air cells, middle ear cavities and paranasal sinuses are clear. CT CERVICAL SPINE FINDINGS No cervical spine fracture. Fusion C2 through C4. Facet degenerative changes C4-5 greater on the left. Minimal anterior slip C4. Spinal stenosis and foraminal narrowing greatest at this level. No abnormal prevertebral soft tissue swelling. If ligamentous injury or cord injury were of high clinical concern, MR imaging could be obtained for further delineation. Coarse calcification left lobe of thyroid gland. Carotid bifurcation calcifications. No lung apical mass. IMPRESSION: CT HEAD No skull fracture or intracranial hemorrhage. Prominent global atrophy without hydrocephalus. Chronic microvascular changes without CT evidence of large acute infarct. CT CERVICAL SPINE No cervical spine fracture. Fusion C2 through  C4. Facet degenerative changes C4-5 greater on the left. Minimal anterior slip C4. Spinal stenosis and foraminal narrowing greatest at this level. Electronically Signed By: Lacy Duverney M.D. On: 01/25/2016 16:30            ____________________________________________   PROCEDURES  Procedure(s) performed: Laceration  Critical Care performed: No  LACERATION REPAIR Performed by: Sharyn Creamer Authorized by: Sharyn Creamer Consent: Verbal consent obtained. Risks and benefits: risks, benefits and alternatives were discussed Consent given by: patient Patient identity confirmed: provided demographic data Prepped and Draped in normal sterile fashion Wound explored  Laceration Location: Lateral of the left lateral canthus  Laceration Length: 2 cm  No Foreign Bodies seen or palpated  Irrigation method: syringe Amount of cleaning: standard  Skin closure: Dermabond  Technique: Standard   Patient tolerance: Patient tolerated the procedure well with no immediate complications.  ____________________________________________   INITIAL IMPRESSION / ASSESSMENT AND PLAN / ED COURSE  Pertinent labs & imaging results that were available during my care of the patient were reviewed by me and considered in my medical decision making (see chart for details).    This presents after fall from wheelchair. Patient does have a  history of falls. Severe dementia. No evidence of emergent etiology, though she does have a laceration which was easily repaired with Dermabond after wound cleansing and cleaning.   Patient alert, oriented to self but not to place. This is apparently her baseline mental status. She denies any other complaints other than injuries left side of face. CTs of the head and neck reassuring. Labs reviewed and reassuring as well. Plan to discharge the patient back to her care facility for ongoing care.   Clinical Course    ____________________________________________   FINAL CLINICAL IMPRESSION(S) / ED DIAGNOSES  Final diagnoses:  Fall, initial encounter  Scalp laceration, initial encounter      Sharyn Creamer, MD 01/25/16 2203

## 2016-01-25 NOTE — ED Notes (Signed)
Patient transported to CT 

## 2016-01-25 NOTE — ED Triage Notes (Signed)
[facility] from [facility] [place], presents with head injury due to fall, reports "pain all  Over"

## 2016-01-25 NOTE — ED Notes (Addendum)
Pt moved to 12H to be able to be monitored from the nurses station.  Fall mat placed beside bed, bed alarm attached to patient.

## 2016-03-05 ENCOUNTER — Emergency Department: Payer: Medicare Other

## 2016-03-05 ENCOUNTER — Emergency Department
Admission: EM | Admit: 2016-03-05 | Discharge: 2016-03-05 | Disposition: A | Discharge status: Home / Self Care | Payer: Medicare Other | Attending: Emergency Medicine | Admitting: Emergency Medicine

## 2016-03-05 ENCOUNTER — Encounter: Payer: Self-pay | Admitting: Emergency Medicine

## 2016-03-05 DIAGNOSIS — I10 Essential (primary) hypertension: Secondary | ICD-10-CM | POA: Insufficient documentation

## 2016-03-05 DIAGNOSIS — I4891 Unspecified atrial fibrillation: Secondary | ICD-10-CM | POA: Insufficient documentation

## 2016-03-05 DIAGNOSIS — F0391 Unspecified dementia with behavioral disturbance: Secondary | ICD-10-CM | POA: Insufficient documentation

## 2016-03-05 DIAGNOSIS — Z79899 Other long term (current) drug therapy: Secondary | ICD-10-CM | POA: Insufficient documentation

## 2016-03-05 DIAGNOSIS — R410 Disorientation, unspecified: Secondary | ICD-10-CM

## 2016-03-05 DIAGNOSIS — R4182 Altered mental status, unspecified: Secondary | ICD-10-CM | POA: Diagnosis present

## 2016-03-05 LAB — COMPREHENSIVE METABOLIC PANEL
ALK PHOS: 87 U/L (ref 38–126)
ALT: 9 U/L — ABNORMAL LOW (ref 14–54)
ANION GAP: 7 (ref 5–15)
AST: 17 U/L (ref 15–41)
Albumin: 3.9 g/dL (ref 3.5–5.0)
BUN: 19 mg/dL (ref 6–20)
CALCIUM: 9.3 mg/dL (ref 8.9–10.3)
CHLORIDE: 102 mmol/L (ref 101–111)
CO2: 30 mmol/L (ref 22–32)
CREATININE: 1.02 mg/dL — AB (ref 0.44–1.00)
GFR, EST AFRICAN AMERICAN: 54 mL/min — AB (ref >60)
GFR, EST NON AFRICAN AMERICAN: 47 mL/min — AB (ref >60)
Glucose, Bld: 105 mg/dL — ABNORMAL HIGH (ref 65–99)
Potassium: 3.7 mmol/L (ref 3.5–5.1)
SODIUM: 139 mmol/L (ref 135–145)
Total Bilirubin: 0.5 mg/dL (ref 0.3–1.2)
Total Protein: 6.9 g/dL (ref 6.5–8.1)

## 2016-03-05 LAB — CBC WITH DIFFERENTIAL/PLATELET
BASOS ABS: 0.1 10*3/uL (ref 0–0.1)
BASOS PCT: 1 %
EOS ABS: 0.2 10*3/uL (ref 0–0.7)
EOS PCT: 3 %
HCT: 31.9 % — ABNORMAL LOW (ref 35.0–47.0)
HEMOGLOBIN: 11.3 g/dL — AB (ref 12.0–16.0)
Lymphocytes Relative: 8 %
Lymphs Abs: 0.5 10*3/uL — ABNORMAL LOW (ref 1.0–3.6)
MCH: 33.5 pg (ref 26.0–34.0)
MCHC: 35.5 g/dL (ref 32.0–36.0)
MCV: 94.5 fL (ref 80.0–100.0)
Monocytes Absolute: 0.5 10*3/uL (ref 0.2–0.9)
Monocytes Relative: 8 %
NEUTROS PCT: 80 %
Neutro Abs: 5.1 10*3/uL (ref 1.4–6.5)
PLATELETS: 156 10*3/uL (ref 150–440)
RBC: 3.38 MIL/uL — AB (ref 3.80–5.20)
RDW: 14.5 % (ref 11.5–14.5)
WBC: 6.4 10*3/uL (ref 3.6–11.0)

## 2016-03-05 LAB — URINALYSIS COMPLETE WITH MICROSCOPIC (ARMC ONLY)
Bacteria, UA: NONE SEEN
Bilirubin Urine: NEGATIVE
Glucose, UA: NEGATIVE mg/dL
HGB URINE DIPSTICK: NEGATIVE
KETONES UR: NEGATIVE mg/dL
Leukocytes, UA: NEGATIVE
NITRITE: NEGATIVE
PROTEIN: NEGATIVE mg/dL
SPECIFIC GRAVITY, URINE: 1.009 (ref 1.005–1.030)
Squamous Epithelial / LPF: NONE SEEN
pH: 7 (ref 5.0–8.0)

## 2016-03-05 LAB — TROPONIN I

## 2016-03-05 NOTE — ED Notes (Signed)
Pt returning to [facility] via EMS. Report given to Danielle at [facility].

## 2016-03-05 NOTE — Discharge Instructions (Signed)
Please seek medical attention for any high fevers, chest pain, shortness of breath, change in behavior, persistent vomiting, bloody stool or any other new or concerning symptoms.  

## 2016-03-05 NOTE — ED Triage Notes (Signed)
Pt presents to ED from [facility]. EMS states they were initially called out for "seizure-like activity" but upon arrival found pt  onlyto be shaking slightly to BUE. Staff at SNF also state pt confusion appears to have increased but were unable to give EMS any more specific information. Pt yells "it hurts!" anytime she is touched anywhere on her body. Pt unable to answer detailed questions at this time d/t dementia. EMS states family is on their way to ED.

## 2016-03-05 NOTE — ED Notes (Signed)
Pt refused to allow nursing or EMS to recheck VS at this time. Pt is alert, oriented to baseline person only. NAD noted at this time. Pt's son at bedside, states pt is at baseline cognitively.

## 2016-03-05 NOTE — ED Provider Notes (Signed)
[facility] Emergency Department Provider Note   ____________________________________________   I have reviewed the triage vital signs and the nursing notes.   HISTORY  Chief Complaint Altered Mental Status   History limited by: Altered Mental Status   HPI Jamse MeadMargaret B Hogg is a 82 y.o. female who presents from the nursing facility today because of concerns for increased confusion and possible seizure-like activity. Patient herself is oriented to herself but unable to give any history. She does deny any pain. Denies any headache, chest pain shortness of breath. She denies any recent illnesses or fever.    Past Medical History:  Diagnosis Date  . Atrial fibrillation (HCC)   . Dementia   . Hypertension   . Thyroid disease     Patient Active Problem List   Diagnosis Date Noted  . Multiple pelvic fractures (HCC) 11/21/2015  . Dehydration 08/22/2015    Past Surgical History:  Procedure Laterality Date  . HIP SURGERY      Prior to Admission medications   Medication Sig Start Date End Date Taking? Authorizing Provider  acetaminophen (TYLENOL) 325 MG tablet Take 650 mg by mouth every 8 (eight) hours as needed for mild pain, moderate pain or fever.    Historical Provider, MD  amitriptyline (ELAVIL) 10 MG tablet Take 20 mg by mouth at bedtime.    Historical Provider, MD  amLODipine (NORVASC) 5 MG tablet Take 1 tablet by mouth daily. 08/13/15   Historical Provider, MD  digoxin (LANOXIN) 0.25 MG tablet Take 0.25 mg by mouth daily.    Historical Provider, MD  feeding supplement, ENSURE ENLIVE, (ENSURE ENLIVE) LIQD Take 237 mLs by mouth 3 (three) times daily with meals. 08/24/15   Alford Highlandichard Wieting, MD  levothyroxine (SYNTHROID, LEVOTHROID) 75 MCG tablet Take 1 tablet by mouth daily. 08/13/15   Historical Provider, MD  lisinopril (PRINIVIL,ZESTRIL) 20 MG tablet Take 1 tablet by mouth daily. 08/13/15   Historical Provider, MD  metoprolol succinate (TOPROL-XL) 25 MG  24 hr tablet Take 1 tablet (25 mg total) by mouth at bedtime. Patient taking differently: Take 25 mg by mouth daily.  08/24/15   Alford Highlandichard Wieting, MD  mirtazapine (REMERON) 7.5 MG tablet Take 7.5 mg by mouth at bedtime.    Historical Provider, MD  oxyCODONE-acetaminophen (PERCOCET/ROXICET) 5-325 MG tablet Take 1 tablet by mouth every 6 (six) hours as needed for moderate pain. 11/23/15   Shaune PollackQing Chen, MD  polyethylene glycol [facility](MIRALAX / Ethelene HalGLYCOLAX) packet Take 17 g by mouth daily as needed for mild constipation.    Historical Provider, MD  senna-docusate (SENOKOT-S) 8.6-50 MG tablet Take 2 tablets by mouth at bedtime.    Historical Provider, MD    Allergies Review of patient's allergies indicates no known allergies.  Family History  Problem Relation Age of Onset  . Hypertension Son     Social History Social History  Substance Use Topics  . Smoking status: Never Smoker  . Smokeless tobacco: Never Used  . Alcohol use No    Review of Systems  Constitutional: Negative for fever. Cardiovascular: Negative for chest pain. Respiratory: Negative for shortness of breath. Gastrointestinal: Negative for abdominal pain, vomiting and diarrhea. Neurological: Negative for headaches, focal weakness or numbness.  10-point ROS otherwise negative.  ____________________________________________   PHYSICAL EXAM:  VITAL SIGNS: ED Triage Vitals [03/05/16 1315]  Enc Vitals Group     BP (!) 151/67     Pulse Rate 61     Resp 17     Temp 98.1 F (36.7  C)     Temp Source Oral     SpO2 98 %     Weight 102 lb 11.2 oz (46.6 kg)     Height 5\' 1"  (1.549 m)   Constitutional: Awake and alert. No acute distress. Oriented to self. Eyes: Conjunctivae are normal. Normal extraocular movements. ENT   Head: Normocephalic and atraumatic.   Nose: No congestion/rhinnorhea.   Mouth/Throat: Mucous membranes are moist.   Neck: No stridor. Hematological/Lymphatic/Immunilogical: No cervical  lymphadenopathy. Cardiovascular: Normal rate, regular rhythm.  No murmurs, rubs, or gallops. Respiratory: Normal respiratory effort without tachypnea nor retractions. Breath sounds are clear and equal bilaterally. No wheezes/rales/rhonchi. Gastrointestinal: Soft and nontender. No distention.  Genitourinary: Deferred Musculoskeletal: Normal range of motion in all extremities. No lower extremity edema. Neurologic:  Awake and alert. Oriented only to self. Appears to be moving all extremities. Skin:  Skin is warm, dry and intact. No rash noted. Psychiatric: Mood and affect are normal. Speech and behavior are normal. Patient exhibits appropriate insight and judgment.  ____________________________________________    LABS (pertinent positives/negatives)  Labs Reviewed  CBC WITH DIFFERENTIAL/PLATELET - Abnormal; Notable for the following:       Result Value   RBC 3.38 (*)    Hemoglobin 11.3 (*)    HCT 31.9 (*)    Lymphs Abs 0.5 (*)    All other components within normal limits  URINALYSIS COMPLETEWITH MICROSCOPIC ([facility] ONLY) - Abnormal; Notable for the following:    Color, Urine YELLOW (*)    APPearance CLEAR (*)    All other components within normal limits  COMPREHENSIVE METABOLIC PANEL - Abnormal; Notable for the following:    Glucose, Bld 105 (*)    Creatinine, Ser 1.02 (*)    ALT 9 (*)    GFR calc non Af Amer 47 (*)    GFR calc Af Amer 54 (*)    All other components within normal limits  TROPONIN I     ____________________________________________   EKG  None  ____________________________________________    RADIOLOGY  CT head IMPRESSION:  No acute abnormality.    Atrophy and chronic microvascular ischemic change.     ____________________________________________   PROCEDURES  Procedures  ____________________________________________   INITIAL IMPRESSION / ASSESSMENT AND PLAN / ED COURSE  Pertinent labs & imaging results that were available during my  care of the patient were reviewed by me and considered in my medical decision making (see chart for details).  Patient coming from nursing facility because of concerns for increased confusion and altered mental status. On exam patient awake and alert. Not completely oriented, in no distress. Will check head ct, blood work and ua.  Clinical Course   Workup without any concerning findings. At this point think patient is safe for discharge home. ____________________________________________   FINAL CLINICAL IMPRESSION(S) / ED DIAGNOSES  Final diagnoses:  Confusion     Note: This dictation was prepared with Dragon dictation. Any transcriptional errors that result from this process are unintentional    Phineas Semen, MD 03/05/16 1501

## 2016-03-11 ENCOUNTER — Emergency Department: Payer: Medicare Other

## 2016-03-11 ENCOUNTER — Observation Stay
Admission: EM | Admit: 2016-03-11 | Discharge: 2016-03-15 | Payer: Medicare Other | Attending: Internal Medicine | Admitting: Internal Medicine

## 2016-03-11 DIAGNOSIS — R509 Fever, unspecified: Secondary | ICD-10-CM

## 2016-03-11 DIAGNOSIS — I739 Peripheral vascular disease, unspecified: Secondary | ICD-10-CM | POA: Insufficient documentation

## 2016-03-11 DIAGNOSIS — Z9889 Other specified postprocedural states: Secondary | ICD-10-CM | POA: Insufficient documentation

## 2016-03-11 DIAGNOSIS — Z880 Allergy status to penicillin: Secondary | ICD-10-CM | POA: Insufficient documentation

## 2016-03-11 DIAGNOSIS — I672 Cerebral atherosclerosis: Secondary | ICD-10-CM | POA: Diagnosis not present

## 2016-03-11 DIAGNOSIS — Z66 Do not resuscitate: Secondary | ICD-10-CM | POA: Diagnosis not present

## 2016-03-11 DIAGNOSIS — Z681 Body mass index (BMI) 19 or less, adult: Secondary | ICD-10-CM | POA: Insufficient documentation

## 2016-03-11 DIAGNOSIS — E440 Moderate protein-calorie malnutrition: Secondary | ICD-10-CM | POA: Diagnosis not present

## 2016-03-11 DIAGNOSIS — L270 Generalized skin eruption due to drugs and medicaments taken internally: Secondary | ICD-10-CM | POA: Diagnosis not present

## 2016-03-11 DIAGNOSIS — Z87440 Personal history of urinary (tract) infections: Secondary | ICD-10-CM | POA: Insufficient documentation

## 2016-03-11 DIAGNOSIS — I4891 Unspecified atrial fibrillation: Secondary | ICD-10-CM | POA: Insufficient documentation

## 2016-03-11 DIAGNOSIS — Z91048 Other nonmedicinal substance allergy status: Secondary | ICD-10-CM | POA: Insufficient documentation

## 2016-03-11 DIAGNOSIS — Z515 Encounter for palliative care: Secondary | ICD-10-CM | POA: Diagnosis not present

## 2016-03-11 DIAGNOSIS — Z91013 Allergy to seafood: Secondary | ICD-10-CM | POA: Diagnosis not present

## 2016-03-11 DIAGNOSIS — Z79899 Other long term (current) drug therapy: Secondary | ICD-10-CM | POA: Insufficient documentation

## 2016-03-11 DIAGNOSIS — D696 Thrombocytopenia, unspecified: Secondary | ICD-10-CM | POA: Diagnosis present

## 2016-03-11 DIAGNOSIS — Z79891 Long term (current) use of opiate analgesic: Secondary | ICD-10-CM | POA: Insufficient documentation

## 2016-03-11 DIAGNOSIS — E039 Hypothyroidism, unspecified: Secondary | ICD-10-CM | POA: Insufficient documentation

## 2016-03-11 DIAGNOSIS — I10 Essential (primary) hypertension: Secondary | ICD-10-CM | POA: Insufficient documentation

## 2016-03-11 DIAGNOSIS — R233 Spontaneous ecchymoses: Secondary | ICD-10-CM | POA: Diagnosis present

## 2016-03-11 DIAGNOSIS — Z993 Dependence on wheelchair: Secondary | ICD-10-CM | POA: Diagnosis not present

## 2016-03-11 DIAGNOSIS — F0390 Unspecified dementia without behavioral disturbance: Secondary | ICD-10-CM | POA: Insufficient documentation

## 2016-03-11 DIAGNOSIS — Z9841 Cataract extraction status, right eye: Secondary | ICD-10-CM | POA: Diagnosis not present

## 2016-03-11 DIAGNOSIS — R627 Adult failure to thrive: Secondary | ICD-10-CM

## 2016-03-11 DIAGNOSIS — G919 Hydrocephalus, unspecified: Secondary | ICD-10-CM | POA: Diagnosis not present

## 2016-03-11 DIAGNOSIS — R4182 Altered mental status, unspecified: Secondary | ICD-10-CM | POA: Diagnosis present

## 2016-03-11 DIAGNOSIS — E876 Hypokalemia: Secondary | ICD-10-CM | POA: Diagnosis not present

## 2016-03-11 DIAGNOSIS — E860 Dehydration: Secondary | ICD-10-CM | POA: Insufficient documentation

## 2016-03-11 DIAGNOSIS — G9341 Metabolic encephalopathy: Principal | ICD-10-CM | POA: Insufficient documentation

## 2016-03-11 LAB — CBC WITH DIFFERENTIAL/PLATELET
Basophils Absolute: 0 10*3/uL (ref 0–0.1)
Basophils Relative: 0 %
EOS ABS: 0.1 10*3/uL (ref 0–0.7)
EOS PCT: 1 %
HCT: 34.6 % — ABNORMAL LOW (ref 35.0–47.0)
HEMOGLOBIN: 11.8 g/dL — AB (ref 12.0–16.0)
LYMPHS ABS: 0.5 10*3/uL — AB (ref 1.0–3.6)
LYMPHS PCT: 5 %
MCH: 32.3 pg (ref 26.0–34.0)
MCHC: 34.1 g/dL (ref 32.0–36.0)
MCV: 94.7 fL (ref 80.0–100.0)
MONOS PCT: 10 %
Monocytes Absolute: 1 10*3/uL — ABNORMAL HIGH (ref 0.2–0.9)
NEUTROS ABS: 8.6 10*3/uL — AB (ref 1.4–6.5)
NEUTROS PCT: 84 %
PLATELETS: 128 10*3/uL — AB (ref 150–440)
RBC: 3.66 MIL/uL — AB (ref 3.80–5.20)
RDW: 14.1 % (ref 11.5–14.5)
WBC: 10.2 10*3/uL (ref 3.6–11.0)

## 2016-03-11 LAB — URINALYSIS COMPLETE WITH MICROSCOPIC (ARMC ONLY)
BILIRUBIN URINE: NEGATIVE
Bacteria, UA: NONE SEEN
GLUCOSE, UA: NEGATIVE mg/dL
Leukocytes, UA: NEGATIVE
NITRITE: NEGATIVE
Protein, ur: 30 mg/dL — AB
SPECIFIC GRAVITY, URINE: 1.015 (ref 1.005–1.030)
Squamous Epithelial / LPF: NONE SEEN
pH: 6 (ref 5.0–8.0)

## 2016-03-11 LAB — MAGNESIUM: MAGNESIUM: 2 mg/dL (ref 1.7–2.4)

## 2016-03-11 LAB — COMPREHENSIVE METABOLIC PANEL
ALK PHOS: 77 U/L (ref 38–126)
ALT: 14 U/L (ref 14–54)
ANION GAP: 8 (ref 5–15)
AST: 18 U/L (ref 15–41)
Albumin: 4 g/dL (ref 3.5–5.0)
BUN: 33 mg/dL — ABNORMAL HIGH (ref 6–20)
CALCIUM: 9.1 mg/dL (ref 8.9–10.3)
CO2: 29 mmol/L (ref 22–32)
CREATININE: 0.79 mg/dL (ref 0.44–1.00)
Chloride: 101 mmol/L (ref 101–111)
Glucose, Bld: 125 mg/dL — ABNORMAL HIGH (ref 65–99)
Potassium: 3.5 mmol/L (ref 3.5–5.1)
SODIUM: 138 mmol/L (ref 135–145)
Total Bilirubin: 0.7 mg/dL (ref 0.3–1.2)
Total Protein: 7.4 g/dL (ref 6.5–8.1)

## 2016-03-11 LAB — MRSA PCR SCREENING: MRSA BY PCR: NEGATIVE

## 2016-03-11 LAB — TROPONIN I
TROPONIN I: 0.05 ng/mL — AB (ref <0.03)
TROPONIN I: 0.04 ng/mL — AB (ref <0.03)

## 2016-03-11 LAB — LACTIC ACID, PLASMA
Lactic Acid, Venous: 1.4 mmol/L (ref 0.5–1.9)
Lactic Acid, Venous: 1.6 mmol/L (ref 0.5–1.9)

## 2016-03-11 MED ORDER — ENSURE ENLIVE PO LIQD
237.0000 mL | Freq: Three times a day (TID) | ORAL | Status: DC
  Administered 2016-03-11 – 2016-03-13 (×3): 237 mL via ORAL

## 2016-03-11 MED ORDER — SENNOSIDES-DOCUSATE SODIUM 8.6-50 MG PO TABS
1.0000 | ORAL_TABLET | Freq: Every evening | ORAL | Status: DC | PRN
  Filled 2016-03-11: qty 1

## 2016-03-11 MED ORDER — HYDRALAZINE HCL 20 MG/ML IJ SOLN: 10.0000 mg | Freq: Four times a day (QID) | INTRAMUSCULAR | Status: DC | PRN

## 2016-03-11 MED ORDER — SODIUM CHLORIDE 0.9 % IV BOLUS (SEPSIS)
1000.0000 mL | Freq: Once | INTRAVENOUS | Status: AC
  Administered 2016-03-11: 1000 mL via INTRAVENOUS

## 2016-03-11 MED ORDER — ONDANSETRON HCL 4 MG PO TABS: 4.0000 mg | ORAL_TABLET | Freq: Four times a day (QID) | ORAL | Status: DC | PRN

## 2016-03-11 MED ORDER — DOCUSATE SODIUM 100 MG PO CAPS
100.0000 mg | ORAL_CAPSULE | Freq: Two times a day (BID) | ORAL | Status: DC
  Administered 2016-03-11 – 2016-03-14 (×3): 100 mg via ORAL
  Filled 2016-03-11 (×4): qty 1

## 2016-03-11 MED ORDER — POTASSIUM CHLORIDE IN NACL 20-0.9 MEQ/L-% IV SOLN
INTRAVENOUS | Status: DC
  Administered 2016-03-11 – 2016-03-14 (×4): via INTRAVENOUS
  Filled 2016-03-11 (×8): qty 1000

## 2016-03-11 MED ORDER — METOPROLOL SUCCINATE ER 25 MG PO TB24
25.0000 mg | ORAL_TABLET | Freq: Every day | ORAL | Status: DC
  Administered 2016-03-11: 25 mg via ORAL
  Filled 2016-03-11: qty 1

## 2016-03-11 MED ORDER — SENNOSIDES-DOCUSATE SODIUM 8.6-50 MG PO TABS
2.0000 | ORAL_TABLET | Freq: Every day | ORAL | Status: DC
  Administered 2016-03-11: 1 via ORAL
  Administered 2016-03-13 – 2016-03-14 (×2): 2 via ORAL
  Filled 2016-03-11 (×2): qty 2

## 2016-03-11 MED ORDER — DIGOXIN 125 MCG PO TABS
0.2500 mg | ORAL_TABLET | Freq: Every day | ORAL | Status: DC
  Administered 2016-03-11 – 2016-03-13 (×2): 0.25 mg via ORAL
  Filled 2016-03-11 (×2): qty 2

## 2016-03-11 MED ORDER — ASPIRIN 81 MG PO CHEW
81.0000 mg | CHEWABLE_TABLET | Freq: Every day | ORAL | Status: DC
  Administered 2016-03-11 – 2016-03-14 (×3): 81 mg via ORAL
  Filled 2016-03-11 (×3): qty 1

## 2016-03-11 MED ORDER — MIRTAZAPINE 15 MG PO TABS
7.5000 mg | ORAL_TABLET | Freq: Every day | ORAL | Status: DC
  Administered 2016-03-13: 21:00:00 7.5 mg via ORAL
  Filled 2016-03-11 (×2): qty 1

## 2016-03-11 MED ORDER — VANCOMYCIN HCL IN DEXTROSE 1-5 GM/200ML-% IV SOLN
1000.0000 mg | Freq: Once | INTRAVENOUS | Status: AC
Start: 2016-03-11 — End: 2016-03-11
  Administered 2016-03-11: 1000 mg via INTRAVENOUS
  Filled 2016-03-11: qty 200

## 2016-03-11 MED ORDER — ONDANSETRON HCL 4 MG/2ML IJ SOLN: 4.0000 mg | Freq: Four times a day (QID) | INTRAMUSCULAR | Status: DC | PRN

## 2016-03-11 MED ORDER — ALBUTEROL SULFATE (2.5 MG/3ML) 0.083% IN NEBU: 2.5000 mg | INHALATION_SOLUTION | RESPIRATORY_TRACT | Status: DC | PRN

## 2016-03-11 MED ORDER — ACETAMINOPHEN 650 MG RE SUPP
650.0000 mg | Freq: Once | RECTAL | Status: AC
  Administered 2016-03-11: 650 mg via RECTAL
  Filled 2016-03-11: qty 1

## 2016-03-11 MED ORDER — ENOXAPARIN SODIUM 40 MG/0.4ML ~~LOC~~ SOLN
40.0000 mg | SUBCUTANEOUS | Status: DC
  Administered 2016-03-11: 17:00:00 40 mg via SUBCUTANEOUS
  Filled 2016-03-11: qty 0.4

## 2016-03-11 MED ORDER — ORAL CARE MOUTH RINSE
15.0000 mL | Freq: Two times a day (BID) | OROMUCOSAL | Status: DC
  Administered 2016-03-11 – 2016-03-14 (×7): 15 mL via OROMUCOSAL

## 2016-03-11 MED ORDER — ACETAMINOPHEN 650 MG RE SUPP: 650.0000 mg | Freq: Four times a day (QID) | RECTAL | Status: DC | PRN

## 2016-03-11 MED ORDER — POLYETHYLENE GLYCOL 3350 17 G PO PACK: 17.0000 g | PACK | Freq: Every day | ORAL | Status: DC | PRN

## 2016-03-11 MED ORDER — AMITRIPTYLINE HCL 10 MG PO TABS
20.0000 mg | ORAL_TABLET | Freq: Every day | ORAL | Status: DC
  Administered 2016-03-13: 20 mg via ORAL
  Filled 2016-03-11 (×2): qty 2

## 2016-03-11 MED ORDER — AMLODIPINE BESYLATE 5 MG PO TABS
5.0000 mg | ORAL_TABLET | Freq: Every day | ORAL | Status: DC
  Administered 2016-03-11: 5 mg via ORAL
  Filled 2016-03-11: qty 1

## 2016-03-11 MED ORDER — ACETAMINOPHEN 325 MG PO TABS
650.0000 mg | ORAL_TABLET | Freq: Four times a day (QID) | ORAL | Status: DC | PRN
  Administered 2016-03-14: 650 mg via ORAL
  Filled 2016-03-11: qty 2

## 2016-03-11 MED ORDER — LISINOPRIL 20 MG PO TABS
20.0000 mg | ORAL_TABLET | Freq: Every day | ORAL | Status: DC
  Administered 2016-03-11: 17:00:00 20 mg via ORAL
  Filled 2016-03-11: qty 1

## 2016-03-11 MED ORDER — PIPERACILLIN-TAZOBACTAM 3.375 G IVPB
3.3750 g | Freq: Once | INTRAVENOUS | Status: AC
  Administered 2016-03-11: 3.375 g via INTRAVENOUS
  Filled 2016-03-11: qty 50

## 2016-03-11 MED ORDER — LEVOTHYROXINE SODIUM 150 MCG PO TABS
75.0000 ug | ORAL_TABLET | Freq: Every day | ORAL | Status: DC
  Administered 2016-03-13 – 2016-03-14 (×2): 75 ug via ORAL
  Filled 2016-03-11 (×2): qty 1

## 2016-03-11 MED ORDER — CHLORHEXIDINE GLUCONATE 0.12 % MT SOLN
15.0000 mL | Freq: Two times a day (BID) | OROMUCOSAL | Status: DC
  Administered 2016-03-12 – 2016-03-14 (×3): 15 mL via OROMUCOSAL
  Filled 2016-03-11 (×2): qty 15

## 2016-03-11 NOTE — ED Notes (Signed)
Pt taken to ct 

## 2016-03-11 NOTE — ED Triage Notes (Signed)
Pt arrives via ems from the [facility], pt has been altered for the past 3 days according to Home place but family refused to let patient come to the hospital, pt is hot to touch, eyes closed and moaning pt has a rash to the underarms, inside of her arms bilaterally and on her flanks as well as her suprapubic area, the rash is not raised and appears like petechia

## 2016-03-11 NOTE — ED Provider Notes (Addendum)
[facility] Emergency Department Provider Note   ____________________________________________   First MD Initiated Contact with Patient 03/11/16 [phone]     (approximate)  I have reviewed the triage vital signs and the nursing notes.   HISTORY  Chief Complaint Altered Mental Status  Caveat-history of present illness and review of systems Limited due to the patient's altered mental status. [facility]  HPI Melanie Tanner is a 82 y.o. female history of dementia, hypertension, thyroid disease who presents for evaluation of decreased level of consciousness/altered mental status worsening over the past 3 days. According to staff, she was started on amoxicillin 3 days ago for UTI, developed a rash and amoxicillin was discontinued. Azithromycin was prescribed for UTI however she has not received the first dose yet. She has had poor oral intake and fatigue. Per staff, no vomiting or diarrhea. No documented fevers at their facility.   Past Medical History:  Diagnosis Date  . Atrial fibrillation (HCC)   . Dementia   . Hypertension   . Thyroid disease     Patient Active Problem List   Diagnosis Date Noted  . Altered mental status 03/11/2016  . Multiple pelvic fractures (HCC) 11/21/2015  . Dehydration 08/22/2015    Past Surgical History:  Procedure Laterality Date  . HIP SURGERY      Prior to Admission medications   Medication Sig Start Date End Date Taking? Authorizing Provider  acetaminophen (TYLENOL) 325 MG tablet Take 650 mg by mouth every 8 (eight) hours as needed for mild pain, moderate pain or fever.    Historical Provider, MD  amitriptyline (ELAVIL) 10 MG tablet Take 20 mg by mouth at bedtime.    Historical Provider, MD  amLODipine (NORVASC) 5 MG tablet Take 1 tablet by mouth daily. 08/13/15   Historical Provider, MD  digoxin (LANOXIN) 0.25 MG tablet Take 0.25 mg by mouth daily.     Historical Provider, MD  feeding supplement, ENSURE ENLIVE, (ENSURE ENLIVE) LIQD Take 237 mLs by mouth 3 (three) times daily with meals. 08/24/15   Alford Highlandichard Wieting, MD  levothyroxine (SYNTHROID, LEVOTHROID) 75 MCG tablet Take 1 tablet by mouth daily. 08/13/15   Historical Provider, MD  lisinopril (PRINIVIL,ZESTRIL) 20 MG tablet Take 1 tablet by mouth daily. 08/13/15   Historical Provider, MD  metoprolol succinate (TOPROL-XL) 25 MG 24 hr tablet Take 1 tablet (25 mg total) by mouth at bedtime. Patient taking differently: Take 25 mg by mouth daily.  08/24/15   Alford Highlandichard Wieting, MD  mirtazapine (REMERON) 7.5 MG tablet Take 7.5 mg by mouth at bedtime.    Historical Provider, MD  oxyCODONE-acetaminophen (PERCOCET/ROXICET) 5-325 MG tablet Take 1 tablet by mouth every 6 (six) hours as needed for moderate pain. 11/23/15   Shaune PollackQing Chen, MD  polyethylene glycol [facility](MIRALAX / Ethelene HalGLYCOLAX) packet Take 17 g by mouth daily as needed for mild constipation.    Historical Provider, MD  senna-docusate (SENOKOT-S) 8.6-50 MG tablet Take 2 tablets by mouth at bedtime.    Historical Provider, MD    Allergies Review of patient's allergies indicates no known allergies.  Family History  Problem Relation Age of Onset  . Hypertension Son     Social History Social History  Substance Use Topics  . Smoking status: Never Smoker  . Smokeless tobacco: Never Used  . Alcohol use No    Review of Systems  Caveat-history of present illness and review of systems Limited due to the patient's  altered mental status. All and information is obtained from staff at [facility] and EMS on arrival. ____________________________________________   PHYSICAL EXAM:  Vitals:   03/11/16 1030 03/11/16 1058 03/11/16 1100 03/11/16 1130  BP: (!) 191/74  (!) 181/68 (!) 175/76  Pulse: 65 67 69 79  Resp: 16  17 16   Temp:  (!) 100.4 F (38 C)    TempSrc:  Rectal    SpO2: 99% 97% 96% 99%  Weight:      Height:        VITAL SIGNS: ED Triage Vitals    Enc Vitals Group     BP      Pulse      Resp      Temp      Temp src      SpO2      Weight      Height      Head Circumference      Peak Flow      Pain Score      Pain Loc      Pain Edu?      Excl. in GC?     Constitutional: Appears fatigued and uncomfortable, moves all extremities spontaneously and equally but does not follow commands, does not verbalize. Eyes: Conjunctivae are normal. PERRL. EOMI. Head: Atraumatic. Nose: No congestion/rhinnorhea. Mouth/Throat: Mucous membranes are dry.  Oropharynx non-erythematous. Neck: No stridor. Appears supple without meningismus. Cardiovascular: Normal rate, regular rhythm. Grossly normal heart sounds.  Good peripheral circulation. Respiratory: Normal respiratory effort.  No retractions. Lungs CTAB. Gastrointestinal: Soft and nontender. No distention.  No CVA tenderness. Genitourinary: deferred Musculoskeletal: No lower extremity tenderness nor edema.  No joint effusions. Neurologic:  Moves all extremities equally and spontaneously but does not follow commands, does not verbalize. Skin:  Skin is warm, dry. Blanching petechial rash in the axilla bilaterally and in the groin. Psychiatric: Unable to assess secondary to altered mental status.  ____________________________________________   LABS (all labs ordered are listed, but only abnormal results are displayed)  Labs Reviewed  CBC WITH DIFFERENTIAL/PLATELET - Abnormal; Notable for the following:       Result Value   RBC 3.66 (*)    Hemoglobin 11.8 (*)    HCT 34.6 (*)    Platelets 128 (*)    Neutro Abs 8.6 (*)    Lymphs Abs 0.5 (*)    Monocytes Absolute 1.0 (*)    All other components within normal limits  COMPREHENSIVE METABOLIC PANEL - Abnormal; Notable for the following:    Glucose, Bld 125 (*)    BUN 33 (*)    All other components within normal limits  TROPONIN I - Abnormal; Notable for the following:    Troponin I 0.05 (*)    All other components within normal limits   URINALYSIS COMPLETEWITH MICROSCOPIC ([facility] ONLY) - Abnormal; Notable for the following:    Color, Urine YELLOW (*)    APPearance CLEAR (*)    Ketones, ur 1+ (*)    Hgb urine dipstick 2+ (*)    Protein, ur 30 (*)    All other components within normal limits  CULTURE, BLOOD (ROUTINE X 2)  CULTURE, BLOOD (ROUTINE X 2)  LACTIC ACID, PLASMA  LACTIC ACID, PLASMA  DIFFERENTIAL   ____________________________________________  EKG  ED ECG REPORT I, Gayla Doss, the attending physician, personally viewed and interpreted this ECG.   Date: 03/11/2016  EKG Time: 09:50  Rate: 69  Rhythm: normal sinus rhythm  Axis: normal  Intervals:none  ST&T  Change: No acute ST elevation. Mild ST depression in V6. Non specific T wave abnormality in V3.   ____________________________________________  RADIOLOGY  CT head IMPRESSION:  Similar findings of advanced atrophy and microvascular ischemic  disease without acute intracranial process.       CXR IMPRESSION:  Minimal left basilar subsegmental atelectasis. Aneurysmal dilatation  of descending thoracic aorta is noted.      ____________________________________________   PROCEDURES  Procedure(s) performed: None  Procedures  Critical Care performed: No  ____________________________________________   INITIAL IMPRESSION / ASSESSMENT AND PLAN / ED COURSE  Pertinent labs & imaging results that were available during my care of the patient were reviewed by me and considered in my medical decision making (see chart for details).  Melanie Tanner is a 82 y.o. female history of dementia, hypertension, thyroid disease who presents for evaluation of decreased level of consciousness/altered mental status worsening over the past 3 days. On exam, she appears fatigued, appears uncomfortable at times, does not follow commands though she does appear to move all of her extremities equally. She does have a blanching petechial rash in the axilla as  well as the groin. No cellulitis or sacral decubitus ulcers. Her neck appears supple without meningismus. Vital signs are stable and she is afebrile, we'll obtain screening labs, chest x-ray, CT head, reassess for disposition. We'll give IV fluids.  ----------------------------------------- 11:20 AM on 03/11/2016 ----------------------------------------- Patient is now febrile with a rectal temperature 100.4. She is not meeting any other sirs criteria but given her mild thrombocytopenia, I am concerned for occult infection, I have ordered IV vancomycin and Zosyn. CBC shows mild anemia with hemoglobin 11.8, mild thrombocytopenia with platelet count of 128,000. CMP generally unremarkable. Troponin is mildly elevated 0.05, lactic acid is reassuring at 1.4. CT head shows no acute intracranial process. Chest x-ray shows possibly mild atelectasis. I've attended to reach her son Onalee Hua as well as daughter Marygrace Drought to discuss her care and wishes for invasive testing/treatment given that she is a DO NOT RESUSCITATE however this far been unsuccessful.  ----------------------------------------- 12:50 PM on 03/11/2016 ----------------------------------------- Patient appears more awake at this time, mildly agitated at times but able to speak at times to her nurse which is an improvement in her appearance from the time of arrival. Her family member Dot, her son's wife, is now at bedside. I had no success reaching son Onalee Hua  on the phone. I discussed with her that the exact cause of fever/mental status is not known currently. We discussed recent benefits of lumbar puncture given rash, fever, altered mental status to rule out meningitis however Dot reports that, in accordance with previous discussions with family, they would not want patient submitted to such invasive testing. Is able to discuss this with her daughter Susan/healthcare power of attorney, who is in agreement with this. Therefore, we'll continue with  IV fluids and antibiotics and serial reassessments. Case discussed with hospitalist for admission at this time. I have ordered manual cbc diff/smear even mild thrombocytopenia, her anemia is mild and not increased from baseline, she does not have any creatinine elevation to support TTP.  Clinical Course     ____________________________________________   FINAL CLINICAL IMPRESSION(S) / ED DIAGNOSES  Final diagnoses:  Altered mental status, unspecified altered mental status type  Fever, unspecified fever cause  Petechiae  Thrombocytopenia (HCC)      NEW MEDICATIONS STARTED DURING THIS VISIT:  New Prescriptions   No medications on file     Note:  This document was  prepared using Conservation officer, historic buildings and may include unintentional dictation errors.    Gayla Doss, MD 03/11/16 1257    Gayla Doss, MD 03/11/16 1610    Gayla Doss, MD 03/11/16 1309

## 2016-03-11 NOTE — H&P (Signed)
[facility] [place] [facility]   PATIENT NAME: Melanie RecordsMargaret Tanner    MR#:  [id number]  DATE OF BIRTH:  08/14/1921  DATE OF ADMISSION:  03/11/2016  PRIMARY CARE PHYSICIAN: Pcp Not In System   REQUESTING/REFERRING PHYSICIAN: Gayla DossEryka A Gayle, MD  CHIEF COMPLAINT:   Chief Complaint  Patient presents with  . Altered Mental Status   Altered mental status for 3 days HISTORY OF PRESENT ILLNESS:  Melanie Tanner  is a 82 y.o. female with a known history of Hypertension, A. fib, thyroid disease and dementia. Patient was sent from nursing home to the ED due to confusion and altered mental status. She had UTI recently, was treated with amoxicillin for 3 days and developed a rash. Then she was treated with Zithromax 1 dose. She was found to have confusion and poor oral intake for the past 3 days. Since patient has fever 100.4, she was treated with vancomycin and Zosyn in the ED. But the nurse noticed patient started to have new skin rash. PAST MEDICAL HISTORY:   Past Medical History:  Diagnosis Date  . Atrial fibrillation (HCC)   . Dementia   . Hypertension   . Thyroid disease     PAST SURGICAL HISTORY:   Past Surgical History:  Procedure Laterality Date  . HIP SURGERY      SOCIAL HISTORY:   Social History  Substance Use Topics  . Smoking status: Never Smoker  . Smokeless tobacco: Never Used  . Alcohol use No    FAMILY HISTORY:   Family History  Problem Relation Age of Onset  . Hypertension Son     DRUG ALLERGIES:  No Known Allergies  REVIEW OF SYSTEMS:   Review of Systems  Unable to perform ROS: Mental status change    MEDICATIONS AT HOME:   Prior to Admission medications   Medication Sig Start Date End Date Taking? Authorizing Provider  acetaminophen (TYLENOL) 325 MG tablet Take 650 mg by mouth every 8 (eight) hours as needed for mild pain, moderate pain or fever.    Historical Provider, MD  amitriptyline (ELAVIL) 10 MG tablet Take 20 mg by  mouth at bedtime.    Historical Provider, MD  amLODipine (NORVASC) 5 MG tablet Take 1 tablet by mouth daily. 08/13/15   Historical Provider, MD  digoxin (LANOXIN) 0.25 MG tablet Take 0.25 mg by mouth daily.    Historical Provider, MD  feeding supplement, ENSURE ENLIVE, (ENSURE ENLIVE) LIQD Take 237 mLs by mouth 3 (three) times daily with meals. 08/24/15   Alford Highlandichard Wieting, MD  levothyroxine (SYNTHROID, LEVOTHROID) 75 MCG tablet Take 1 tablet by mouth daily. 08/13/15   Historical Provider, MD  lisinopril (PRINIVIL,ZESTRIL) 20 MG tablet Take 1 tablet by mouth daily. 08/13/15   Historical Provider, MD  metoprolol succinate (TOPROL-XL) 25 MG 24 hr tablet Take 1 tablet (25 mg total) by mouth at bedtime. Patient taking differently: Take 25 mg by mouth daily.  08/24/15   Alford Highlandichard Wieting, MD  mirtazapine (REMERON) 7.5 MG tablet Take 7.5 mg by mouth at bedtime.    Historical Provider, MD  oxyCODONE-acetaminophen (PERCOCET/ROXICET) 5-325 MG tablet Take 1 tablet by mouth every 6 (six) hours as needed for moderate pain. 11/23/15   Shaune PollackQing Loanne Emery, MD  polyethylene glycol [facility](MIRALAX / Ethelene HalGLYCOLAX) packet Take 17 g by mouth daily as needed for mild constipation.    Historical Provider, MD  senna-docusate (SENOKOT-S) 8.6-50 MG tablet Take 2 tablets by mouth at bedtime.    Historical Provider, MD  VITAL SIGNS:  Blood pressure (!) 175/76, pulse 79, temperature (!) 100.4 F (38 C), temperature source Rectal, resp. rate 16, height 5\' 1"  (1.549 m), weight 102 lb (46.3 kg), SpO2 99 %.  PHYSICAL EXAMINATION:  Physical Exam  Constitutional: No distress.  Fragile and lethargy.  HENT:  Head: Normocephalic.  Eyes: Pupils are equal, round, and reactive to light. No scleral icterus.  Neck: No JVD present.  Cardiovascular: Normal rate, regular rhythm and normal heart sounds.  Exam reveals no gallop.   No murmur heard. Pulmonary/Chest: Effort normal and breath sounds normal. No respiratory distress. She has no wheezes. She has no  rales.  Abdominal: Soft. Bowel sounds are normal. She exhibits no distension. There is no tenderness.  Musculoskeletal: She exhibits no edema.  Lymphadenopathy:    She has no cervical adenopathy.  Neurological:  The patient is confused, unresponsive to stimuli, unable to exam.  Skin: Skin is dry. Rash noted.  Psychiatric:  Confused and lethargic.    LABORATORY PANEL:   CBC  Recent Labs Lab 03/11/16 1022  WBC 10.2  HGB 11.8*  HCT 34.6*  PLT 128*   ------------------------------------------------------------------------------------------------------------------  Chemistries   Recent Labs Lab 03/11/16 1022  NA 138  K 3.5  CL 101  CO2 29  GLUCOSE 125*  BUN 33*  CREATININE 0.79  CALCIUM 9.1  AST 18  ALT 14  ALKPHOS 77  BILITOT 0.7   ------------------------------------------------------------------------------------------------------------------  Cardiac Enzymes  Recent Labs Lab 03/11/16 1022  TROPONINI 0.05*   ------------------------------------------------------------------------------------------------------------------  RADIOLOGY:  Dg Chest 2 View  Result Date: 03/11/2016 CLINICAL DATA:  Altered mental status. EXAM: CHEST  2 VIEW COMPARISON:  Radiograph of Nov 21, 2015. FINDINGS: Stable cardiomediastinal silhouette with aneurysmal dilatation of descending thoracic aorta. No pneumothorax or pleural effusion is noted. Right lung is clear. Minimal left basilar subsegmental atelectasis is noted. Bony thorax is unremarkable. IMPRESSION: Minimal left basilar subsegmental atelectasis. Aneurysmal dilatation of descending thoracic aorta is noted. Electronically Signed   By: Lupita Raider, M.D.   On: 03/11/2016 10:38   Ct Head Wo Contrast  Result Date: 03/11/2016 CLINICAL DATA:  History dementia with worsening mental status for the past 3 days. EXAM: CT HEAD WITHOUT CONTRAST TECHNIQUE: Contiguous axial images were obtained from the base of the skull through the  vertex without intravenous contrast. COMPARISON:  03/05/2016; 01/25/2016 FINDINGS: Brain: Similar findings of advanced atrophy with sulcal prominence centralized volume loss with commensurate expected dilatation the ventricular system again asymmetrically involving the occipital horn of left lateral ventricle. Rather extensive periventricular hypodensities compatible microvascular ischemic disease. Given background parenchymal abnormalities, there is no CT evidence of superimposed acute large territory infarct. Unchanged punctate (approximately 0.4 x 0.3 cm calcification about the left side of the midline falx (coronal image 32, series 4), favored to represent a dystrophic calcification versus a tiny meningioma. Otherwise, no intraparenchymal or extra-axial mass. No intraparenchymal or extra-axial hemorrhage. Normal size and configuration of the ventricles and the basilar cisterns. No midline shift. Vascular: Intracranial atherosclerosis Skull: No displaced calvarial fracture. Sinuses/Orbits: Limited visualization the paranasal sinuses and mastoid air cells is normal. No air-fluid levels. Post right-sided cataract surgery. Other: Regional soft tissues appear normal. IMPRESSION: Similar findings of advanced atrophy and microvascular ischemic disease without acute intracranial process. Electronically Signed   By: Simonne Come M.D.   On: 03/11/2016 10:19      IMPRESSION AND PLAN:   Altered mental status. Possible due to dehydration.  The patient will be placed for observation. Fall and aspiration  precaution. Start IV fluid to support and follow-up BMP.  Recent UTI. The patient was treated with amoxicillin in nursing home, developed rash. She developed rash after vancomycin in the ED today. I will hold antibiotics at this time due to no obvious infection.  Elevated troponin, possible due to dehydration. Follow-up troponin level. Start aspirin. Hypertension. Continue home hypertension  medication. Thrombocytopenia. Looks like chronic, follow-up CBC.   All the records are reviewed and case discussed with ED provider. Management plans discussed with the patient's daughter-in-law and they are in agreement.  CODE STATUS: DO NOT RESUSCITATE  TOTAL TIME TAKING CARE OF THIS PATIENT: 50 minutes.    Shaune Pollack M.D on 03/11/2016 at 1:48 PM  Between 7am to 6pm - Pager - [phone]  After 6pm go to [email or web address] - Social research officer, government  [facility]  Office  [phone]  CC: Primary care physician; Pcp Not In System   Note: This dictation was prepared with Dragon dictation along with smaller phrase technology. Any transcriptional errors that result from this process are unintentional.

## 2016-03-12 LAB — BASIC METABOLIC PANEL
Anion gap: 7 (ref 5–15)
BUN: 29 mg/dL — AB (ref 6–20)
CHLORIDE: 108 mmol/L (ref 101–111)
CO2: 26 mmol/L (ref 22–32)
CREATININE: 0.81 mg/dL (ref 0.44–1.00)
Calcium: 8.5 mg/dL — ABNORMAL LOW (ref 8.9–10.3)
GFR calc Af Amer: >60 mL/min (ref >60)
GFR calc non Af Amer: >60 mL/min (ref >60)
GLUCOSE: 105 mg/dL — AB (ref 65–99)
POTASSIUM: 3.3 mmol/L — AB (ref 3.5–5.1)
Sodium: 141 mmol/L (ref 135–145)

## 2016-03-12 LAB — CBC
HEMATOCRIT: 29.5 % — AB (ref 35.0–47.0)
HEMOGLOBIN: 10.1 g/dL — AB (ref 12.0–16.0)
MCH: 32.2 pg (ref 26.0–34.0)
MCHC: 34.2 g/dL (ref 32.0–36.0)
MCV: 94.1 fL (ref 80.0–100.0)
Platelets: 112 10*3/uL — ABNORMAL LOW (ref 150–440)
RBC: 3.13 MIL/uL — AB (ref 3.80–5.20)
RDW: 14.3 % (ref 11.5–14.5)
WBC: 11.4 10*3/uL — ABNORMAL HIGH (ref 3.6–11.0)

## 2016-03-12 MED ORDER — HYDRALAZINE HCL 20 MG/ML IJ SOLN
10.0000 mg | Freq: Four times a day (QID) | INTRAMUSCULAR | Status: DC | PRN
  Administered 2016-03-13 – 2016-03-14 (×3): 10 mg via INTRAVENOUS
  Filled 2016-03-12 (×4): qty 1

## 2016-03-12 MED ORDER — ENOXAPARIN SODIUM 30 MG/0.3ML ~~LOC~~ SOLN
30.0000 mg | SUBCUTANEOUS | Status: DC
  Administered 2016-03-12 – 2016-03-14 (×3): 30 mg via SUBCUTANEOUS
  Filled 2016-03-12 (×3): qty 0.3

## 2016-03-12 MED ORDER — CLONIDINE HCL 0.1 MG/24HR TD PTWK
0.1000 mg | MEDICATED_PATCH | TRANSDERMAL | Status: DC
Start: 2016-03-12 — End: 2016-03-15
  Administered 2016-03-12 – 2016-03-14 (×2): 0.1 mg via TRANSDERMAL
  Filled 2016-03-12 (×2): qty 1

## 2016-03-12 NOTE — Care Management Obs Status (Signed)
MEDICARE OBSERVATION STATUS NOTIFICATION   Patient Details  Name: Melanie Tanner MRN: [id number] Date of Birth: 11/26/1921   Medicare Observation Status Notification Given:  Yes (signed by Nicole Cellaorothy daughter in law)    Caren MacadamMichelle Loyalty Arentz, RN 03/12/2016, 3:11 PM

## 2016-03-12 NOTE — Progress Notes (Signed)
[facility] - [place] [facility]   PATIENT NAME: Melanie Tanner    MR#:  [id number]  DATE OF BIRTH:  04/10/1922  SUBJECTIVE:  CHIEF COMPLAINT:   Chief Complaint  Patient presents with  . Altered Mental Status   - patient brought in for altered mental status. Alert this morning but drifting back to sleep. Not following any commands. Not verbal at this time.  REVIEW OF SYSTEMS:  Review of Systems  Unable to perform ROS: Mental status change    DRUG ALLERGIES:   Allergies  Allergen Reactions  . Amoxicillin     Other reaction(s): Unknown  . Iodine     Other reaction(s): Unknown  . Shellfish-Derived Products     Other reaction(s): Unknown    VITALS:  Blood pressure (!) 170/81, pulse 85, temperature 98 F (36.7 C), resp. rate 18, height 5' (1.524 m), weight 42.9 kg (94 lb 8 oz), SpO2 98 %.  PHYSICAL EXAMINATION:  Physical Exam  GENERAL:  82 y.o.-year-old patient lying in the bed with no acute distress.  EYES: Pupils equal, round, reactive to light and accommodation. No scleral icterus. Extraocular muscles intact.  HEENT: Head atraumatic, normocephalic. Oropharynx and nasopharynx clear.  NECK:  Supple, no jugular venous distention. No thyroid enlargement, no tenderness.  LUNGS: Normal breath sounds bilaterally, no wheezing, rales,rhonchi or crepitation. No use of accessory muscles of respiration. Decreased bibasilar breath sounds CARDIOVASCULAR: S1, S2 normal. No  rubs, or gallops. 2/6 systolic murmur is present ABDOMEN: Soft, nontender, nondistended. Bowel sounds present. No organomegaly or mass.  EXTREMITIES: No pedal edema, cyanosis, or clubbing.  NEUROLOGIC: moving all extremities in bed. Not following commands. Not tracking. PSYCHIATRIC: The patient is alert but not verbal at this time and not oriented.  SKIN: fading macular rash in both axilla tracing to lower back, also noted in suprapubic region.   LABORATORY PANEL:   CBC  Recent  Labs Lab 03/12/16 0436  WBC 11.4*  HGB 10.1*  HCT 29.5*  PLT 112*   ------------------------------------------------------------------------------------------------------------------  Chemistries   Recent Labs Lab 03/11/16 1022 03/11/16 1449 03/12/16 0436  NA 138  --  141  K 3.5  --  3.3*  CL 101  --  108  CO2 29  --  26  GLUCOSE 125*  --  105*  BUN 33*  --  29*  CREATININE 0.79  --  0.81  CALCIUM 9.1  --  8.5*  MG  --  2.0  --   AST 18  --   --   ALT 14  --   --   ALKPHOS 77  --   --   BILITOT 0.7  --   --    ------------------------------------------------------------------------------------------------------------------  Cardiac Enzymes  Recent Labs Lab 03/11/16 1449  TROPONINI 0.04*   ------------------------------------------------------------------------------------------------------------------  RADIOLOGY:  Dg Chest 2 View  Result Date: 03/11/2016 CLINICAL DATA:  Altered mental status. EXAM: CHEST  2 VIEW COMPARISON:  Radiograph of Nov 21, 2015. FINDINGS: Stable cardiomediastinal silhouette with aneurysmal dilatation of descending thoracic aorta. No pneumothorax or pleural effusion is noted. Right lung is clear. Minimal left basilar subsegmental atelectasis is noted. Bony thorax is unremarkable. IMPRESSION: Minimal left basilar subsegmental atelectasis. Aneurysmal dilatation of descending thoracic aorta is noted. Electronically Signed   By: Lupita RaiderJames  Green Jr, M.D.   On: 03/11/2016 10:38   Ct Head Wo Contrast  Result Date: 03/11/2016 CLINICAL DATA:  History dementia with worsening mental status for the past 3 days. EXAM: CT HEAD WITHOUT CONTRAST  TECHNIQUE: Contiguous axial images were obtained from the base of the skull through the vertex without intravenous contrast. COMPARISON:  03/05/2016; 01/25/2016 FINDINGS: Brain: Similar findings of advanced atrophy with sulcal prominence centralized volume loss with commensurate expected dilatation the ventricular system  again asymmetrically involving the occipital horn of left lateral ventricle. Rather extensive periventricular hypodensities compatible microvascular ischemic disease. Given background parenchymal abnormalities, there is no CT evidence of superimposed acute large territory infarct. Unchanged punctate (approximately 0.4 x 0.3 cm calcification about the left side of the midline falx (coronal image 32, series 4), favored to represent a dystrophic calcification versus a tiny meningioma. Otherwise, no intraparenchymal or extra-axial mass. No intraparenchymal or extra-axial hemorrhage. Normal size and configuration of the ventricles and the basilar cisterns. No midline shift. Vascular: Intracranial atherosclerosis Skull: No displaced calvarial fracture. Sinuses/Orbits: Limited visualization the paranasal sinuses and mastoid air cells is normal. No air-fluid levels. Post right-sided cataract surgery. Other: Regional soft tissues appear normal. IMPRESSION: Similar findings of advanced atrophy and microvascular ischemic disease without acute intracranial process. Electronically Signed   By: Simonne Come M.D.   On: 03/11/2016 10:19    EKG:   Orders placed or performed during the hospital encounter of 03/11/16  . EKG 12-Lead  . EKG 12-Lead    ASSESSMENT AND PLAN:   82 year old female with past medical history significant for A. Fib, hypertension, hypothyroidism, dementia presents to the hospital secondary to altered mental status.  #1 altered mental status-metabolic encephalopathy likely. -CT of the head without any acute findings. No focal deficits. -Recently treated for urinary tract infection last week with amoxicillin but had to be stopped due to  significant rash - urine without any bacteria now. Low grade fever and slightly elevated wbc- off ABX - EEG if does not improve as ? Seizure like activity yesterday  #2 Rash- likely drug rash- fading now No benadryl as made her very drowsy  #3  hypokalemia-receiving potassium in IV fluids.  #4 hypertension-unable to take oral medications. Started on clonidine patch and IV hydralazine as needed.  #5 atrial fibrillation-rate controlled. On digoxin. Not on any anticoagulation other than aspirin  #6 DVT  Prophylaxis-Lovenox  Discussed with daughter over the phone. At baseline patient is wheelchair-bound but able to converse okay and able to feed herself before 2 weeks ago.    All the records are reviewed and case discussed with Care Management/Social Workerr. Management plans discussed with the patient, family and they are in agreement.  CODE STATUS: DO NOT RESUSCITATE  TOTAL TIME TAKING CARE OF THIS PATIENT: 38 minutes.   POSSIBLE D/C IN 2 DAYS, DEPENDING ON CLINICAL CONDITION.   Enid Baas M.D on 03/12/2016 at 9:27 AM  Between 7am to 6pm - Pager - [phone]  After 6pm go to [email or web address] - password [email or web address]  [facility]  Office  [phone]  CC: Primary care physician; Pcp Not In System

## 2016-03-12 NOTE — Progress Notes (Signed)
82 y/o F on Lovenox 40 mg daily. With CrCl 30 ml/min and weight 42.9 kg, will decrease Lovenox dose to 30 mg daily.   Luisa HartScott Altamese Deguire, PharmD Clinical Pharmacist

## 2016-03-12 NOTE — NC FL2 (Addendum)
[place] MEDICAID FL2 LEVEL OF CARE SCREENING TOOL     IDENTIFICATION  Patient Name: Melanie Tanner Birthdate: 03/06/1922 Sex: female Admission Date (Current Location): 03/11/2016  [place] [facility]   Facility and Address:  [facility], [address], [place], [place] [address]      Provider Number: [id number]  Attending Physician Name and Address:  Enid Baasadhika Kalisetti, MD  Relative Name and Phone Number:       Current Level of Care: Hospital Recommended Level of Care: Assisted Living Facility Prior Approval Number:    Date Approved/Denied:   PASRR Number:    Discharge Plan: Domiciliary (Rest home)    Current Diagnoses: Patient Active Problem List   Diagnosis Date Noted  . Altered mental status 03/11/2016  . Multiple pelvic fractures (HCC) 11/21/2015  . Dehydration 08/22/2015    Orientation RESPIRATION BLADDER Height & Weight     Self  Normal Incontinent Weight: 94 lb 8 oz (42.9 kg) Height:  5' (152.4 cm)  BEHAVIORAL SYMPTOMS/MOOD NEUROLOGICAL BOWEL NUTRITION STATUS   None  None Continent   Heart   AMBULATORY STATUS COMMUNICATION OF NEEDS Skin   Supervised Verbally Normal                       Personal Care Assistance Level of Assistance  Bathing, Dressing, Feeding Bathing Assistance: Limited assistance  Feeding Assistance: Limited assistance  Dressing Assistance: Limited assistance     Functional Limitations Info             SPECIAL CARE FACTORS FREQUENCY                       Contractures Contractures Info: Present    Additional Factors Info  Code Status, Allergies; Isolation Precautions Code Status Info: DNR Allergies Info: Amoxicillin, Iodine, Shellfish-derived Products  Isolation Precautions: Contact Precautions- VRE           Discharge Medications: DISCHARGE MEDICATIONS:     Medication List    STOP taking these medications   acetaminophen 500 MG tablet Commonly  known as:  TYLENOL Replaced by:  acetaminophen 650 MG suppository  amitriptyline 10 MG tablet Commonly known as:  ELAVIL  azithromycin 250 MG tablet Commonly known as:  ZITHROMAX  digoxin 0.125 MG tablet Commonly known as:  LANOXIN  diphenhydrAMINE 25 MG tablet Commonly known as:  BENADRYL  feeding supplement (ENSURE ENLIVE) Liqd  levothyroxine 75 MCG tablet Commonly known as:  SYNTHROID, LEVOTHROID  lisinopril 30 MG tablet Commonly known as:  PRINIVIL,ZESTRIL  metoprolol succinate 25 MG 24 hr tablet Commonly known as:  TOPROL-XL  mirtazapine 15 MG tablet Commonly known as:  REMERON  POLYETHYLENE GLYCOL 3350 PO  senna-docusate 8.6-50 MG tablet Commonly known as:  Senokot-S    TAKE these medications   acetaminophen 650 MG suppository Commonly known as:  TYLENOL Place 1 suppository (650 mg total) rectally every 6 (six) hours as needed for mild pain (or Fever >/= 101). Replaces:  acetaminophen 500 MG tablet  cloNIDine 0.2 mg/24hr patch Commonly known as:  CATAPRES - Dosed in mg/24 hr Place 1 patch (0.2 mg total) onto the skin once a week.  morphine CONCENTRATE 10 MG/0.5ML Soln concentrated solution Take 0.25 mLs (5 mg total) by mouth every 8 (eight) hours.  morphine CONCENTRATE 10 MG/0.5ML Soln concentrated solution Take 0.25 mLs (5 mg total) by mouth every hour as needed for moderate pain, severe pain or shortness of breath.  Relevant Imaging Results:  Relevant Lab Results:   Additional Information  SS [id number]  Judi Cong, LCSW

## 2016-03-12 NOTE — Clinical Social Work Note (Signed)
Patient's family requested CSW at bedside to discuss decision makers for patient. Melanie Tanner, the patient's daughter, [place] be [place] of Wednesday for 2 weeks and only reachable by email: [email or web address]   In the event of needing contact with the family, they request that Melanie Tanner be called first, and that he can contact Melanie Tanner if there is an emergent need. If the need is not emergent or urgent, they request email to [email or web address].  Patient's family also had questions about hospice in the ALF. CSW explained differences between "home with hospice" and hospice home. Family members thanked CSW for information and were able to verbalize in their own terms the information that was provided. CSW will con't to follow for DC planning.

## 2016-03-12 NOTE — Clinical Social Work Note (Signed)
Clinical Social Work Assessment  Patient Details  Name: Melanie Tanner MRN: [id number] Date of Birth: 08/31/1921  Date of referral:  03/12/16               Reason for consult:  Facility Placement                Permission sought to share information with:  Facility Medical sales representativeContact Representative Permission granted to share information::  Yes, Verbal Permission Granted  Name::        Agency::  [facility]  Relationship::     Contact Information:  [phone]  Housing/Transportation Living arrangements for the past 2 months:  Assisted Living Facility Source of Information:  Adult Children Patient Interpreter Needed:  None Criminal Activity/Legal Involvement Pertinent to Current Situation/Hospitalization:  No - Comment as needed Significant Relationships:  Adult Children Lives with:  Facility Resident Do you feel safe going back to the place where you live?  Yes Need for family participation in patient care:  Yes (Comment)  Care giving concerns:  Patient admitted from ALF ([facility])   Social Worker assessment / plan:  Patient was sleeping when CSW visited bedside. CSW contacted patient's son for information.  CSW explained role to patient's son. At baseline, patient has limited assistance with dressing, full assistance with bathing, and she is independent with feeding. Patient has not ambulated since a fall in May 2017, but she receives PT at the facility and uses a WC. Patient is typically only oriented to self with fluctuating orientation to place. Patient uses toilet with assistance and has adult diapers as back up for bladder incontinence.   DC plan is to return to facility once the patient is medically stable. Patient's son gave verbal permission to contact the facility.  Employment status:  Retired Database administratornsurance information:  Managed Medicare PT Recommendations:  Not assessed at this time Information / Referral to community resources:     Patient/Family's  Response to care:  Patient's son was appropriately concerned and thanked the CSW for assistance.  Patient/Family's Understanding of and Emotional Response to Diagnosis, Current Treatment, and Prognosis:  Patient's son was able to verbalize in his own terms the potential dc plan.  Emotional Assessment Appearance:  Appears stated age Attitude/Demeanor/Rapport:   (Patient has dementia) Affect (typically observed):  Unable to Assess Orientation:  Oriented to Self Alcohol / Substance use:  Never Used Psych involvement (Current and /or in the community):  No (Comment)  Discharge Needs  Concerns to be addressed:  Discharge Planning Concerns Readmission within the last 30 days:  No Current discharge risk:  Chronically ill Barriers to Discharge:  Continued Medical Work up   [employer] CorporationKaren M Embry Manrique, LCSW 03/12/2016, 9:21 AM

## 2016-03-13 ENCOUNTER — Observation Stay: Payer: Medicare Other

## 2016-03-13 DIAGNOSIS — R4182 Altered mental status, unspecified: Secondary | ICD-10-CM

## 2016-03-13 LAB — BASIC METABOLIC PANEL
ANION GAP: 11 (ref 5–15)
BUN: 24 mg/dL — AB (ref 6–20)
CALCIUM: 8.5 mg/dL — AB (ref 8.9–10.3)
CO2: 25 mmol/L (ref 22–32)
Chloride: 105 mmol/L (ref 101–111)
Creatinine, Ser: 0.79 mg/dL (ref 0.44–1.00)
GFR calc Af Amer: >60 mL/min (ref >60)
GLUCOSE: 88 mg/dL (ref 65–99)
Potassium: 3.1 mmol/L — ABNORMAL LOW (ref 3.5–5.1)
SODIUM: 141 mmol/L (ref 135–145)

## 2016-03-13 LAB — CBC
HCT: 31.1 % — ABNORMAL LOW (ref 35.0–47.0)
Hemoglobin: 10.9 g/dL — ABNORMAL LOW (ref 12.0–16.0)
MCH: 33.3 pg (ref 26.0–34.0)
MCHC: 34.9 g/dL (ref 32.0–36.0)
MCV: 95.3 fL (ref 80.0–100.0)
PLATELETS: 120 10*3/uL — AB (ref 150–440)
RBC: 3.27 MIL/uL — ABNORMAL LOW (ref 3.80–5.20)
RDW: 13.9 % (ref 11.5–14.5)
WBC: 7.3 10*3/uL (ref 3.6–11.0)

## 2016-03-13 LAB — DIGOXIN LEVEL: DIGOXIN LVL: 0.8 ng/mL (ref 0.8–2.0)

## 2016-03-13 MED ORDER — METOPROLOL TARTRATE 25 MG PO TABS
25.0000 mg | ORAL_TABLET | Freq: Two times a day (BID) | ORAL | Status: DC
  Administered 2016-03-13 – 2016-03-14 (×3): 25 mg via ORAL
  Filled 2016-03-13 (×3): qty 1

## 2016-03-13 MED ORDER — DIGOXIN 125 MCG PO TABS
0.1250 mg | ORAL_TABLET | Freq: Every day | ORAL | Status: DC
  Administered 2016-03-14: 0.125 mg via ORAL
  Filled 2016-03-13: qty 1

## 2016-03-13 MED ORDER — LISINOPRIL 20 MG PO TABS
20.0000 mg | ORAL_TABLET | Freq: Every day | ORAL | Status: DC
  Administered 2016-03-13 – 2016-03-14 (×2): 20 mg via ORAL
  Filled 2016-03-13 (×2): qty 1

## 2016-03-13 MED ORDER — AMLODIPINE BESYLATE 5 MG PO TABS: 5.0000 mg | ORAL_TABLET | Freq: Every day | ORAL | Status: DC

## 2016-03-13 NOTE — Progress Notes (Signed)
[facility] [place] [facility]   PATIENT NAME: [name]    MR#:  [id number]  DATE OF BIRTH:  07/15/1921  SUBJECTIVE:  CHIEF COMPLAINT:   Chief Complaint  Patient presents with  . Altered Mental Status   - More alert today. Extremely hard of hearing. Unable to follow any commands. Occasionally following some simple commands. Seems to be in agony/pain whenever moving her legs.  REVIEW OF SYSTEMS:  Review of Systems  Unable to perform ROS: Mental status change    DRUG ALLERGIES:   Allergies  Allergen Reactions  . Amoxicillin     Other reaction(s): Unknown  . Iodine     Other reaction(s): Unknown  . Shellfish-Derived Products     Other reaction(s): Unknown    VITALS:  Blood pressure (!) 188/78, pulse 68, temperature 98.7 F (37.1 C), temperature source Oral, resp. rate 18, height 5' (1.524 m), weight 42.9 kg (94 lb 8 oz), SpO2 98 %.  PHYSICAL EXAMINATION:  Physical Exam  GENERAL:  82 y.o.-year-old patient lying in the bed with no acute distress.  EYES: Pupils equal, round, reactive to light and accommodation. No scleral icterus. Extraocular muscles intact.  HEENT: Head atraumatic, normocephalic. Oropharynx and nasopharynx clear.  NECK:  Supple, no jugular venous distention. No thyroid enlargement, no tenderness.  LUNGS: Normal breath sounds bilaterally, no wheezing, rales,rhonchi or crepitation. No use of accessory muscles of respiration. Decreased bibasilar breath sounds CARDIOVASCULAR: S1, S2 normal. No  rubs, or gallops. 2/6 systolic murmur is present ABDOMEN: Soft, nontender, nondistended. Bowel sounds present. No organomegaly or mass.  EXTREMITIES: No pedal edema, cyanosis, or clubbing.  NEUROLOGIC: Appears globally weak. Followed couple of simple commands from the neurologist was testing her. Unable to do a complete neuro exam. PSYCHIATRIC: The patient is alert but not verbal at this time and not oriented.  SKIN: fading macular rash in  both axilla tracing to lower back, also noted in suprapubic region.   LABORATORY PANEL:   CBC  Recent Labs Lab 03/13/16 0439  WBC 7.3  HGB 10.9*  HCT 31.1*  PLT 120*   ------------------------------------------------------------------------------------------------------------------  Chemistries   Recent Labs Lab 03/11/16 1022 03/11/16 1449  03/13/16 0439  NA 138  --   < > 141  K 3.5  --   < > 3.1*  CL 101  --   < > 105  CO2 29  --   < > 25  GLUCOSE 125*  --   < > 88  BUN 33*  --   < > 24*  CREATININE 0.79  --   < > 0.79  CALCIUM 9.1  --   < > 8.5*  MG  --  2.0  --   --   AST 18  --   --   --   ALT 14  --   --   --   ALKPHOS 77  --   --   --   BILITOT 0.7  --   --   --   < > = values in this interval not displayed. ------------------------------------------------------------------------------------------------------------------  Cardiac Enzymes  Recent Labs Lab 03/11/16 1449  TROPONINI 0.04*   ------------------------------------------------------------------------------------------------------------------  RADIOLOGY:  No results found.  EKG:   Orders placed or performed during the hospital encounter of 03/11/16  . EKG 12-Lead  . EKG 12-Lead    ASSESSMENT AND PLAN:   82 year old female with past medical history significant for A. Fib, hypertension, hypothyroidism, dementia presents to the hospital secondary to altered  mental status.  #1 altered mental status-metabolic encephalopathy likely. -No sedating medications. Appreciate neurology consult. MRI has been recommended. However due to patient being so restless, not sure if she can lay still for the MRI. No focal deficits are noted. -We will discuss with family about how aggressive they want to be -CT of the head without any acute findings. No focal deficits. -Recently treated for urinary tract infection last week with amoxicillin but had to be stopped due to  significant rash - urine without any  bacteria now. No further fevers at this time. Off of any antibiotics - EEG ordered as well  #2 Rash- likely drug rash- fading now No benadryl as made her very drowsy  #3 hypokalemia-receiving potassium in IV fluids.  #4 Hypertension- now that she is more awake, we will order oral medications- metoprolol and lisinopril.. Started on clonidine patch and IV hydralazine as needed.  #5 atrial fibrillation-rate controlled. On digoxin. Not on any anticoagulation other than aspirin  #6 DVT  Prophylaxis-Lovenox  Called daughter and left a voicemail. However will need to figure out who the legal guardian use as a APS is listed.    All the records are reviewed and case discussed with Care Management/Social Workerr. Management plans discussed with the patient, family and they are in agreement.  CODE STATUS: DO NOT RESUSCITATE  TOTAL TIME TAKING CARE OF THIS PATIENT: 36 minutes.   POSSIBLE D/C IN 2 DAYS, DEPENDING ON CLINICAL CONDITION.   Enid Baas M.D on 03/13/2016 at 5:32 PM  Between 7am to 6pm - Pager - [phone]  After 6pm go to [email or web address] - password [email or web address]  [facility]  Office  [phone]  CC: Primary care physician; Pcp Not In System

## 2016-03-13 NOTE — Progress Notes (Addendum)
CSW contacted [facility] [place] [facility] [phone]. She's out of the office today 03/13/16. CSW cotnacated Ms. Dorothy SparkCarr's Supervisor- Braxton FeathersKylie Jennings [phone]. Left voicemail. Awaiting phone call back.  Woodroe Modehristina Kierstin January, MSW, LCSW, LCAS-A Clinical Social Worker [phone]

## 2016-03-13 NOTE — Consult Note (Signed)
Reason for Consult:Altered mental status Referring Physician: Nemiah CommanderKalisetti  CC: Altered mental status  HPI: Melanie Tanner is an 82 y.o. female with a known history of hypertension, A. fib, thyroid disease and dementia  who was sent from nursing home to the ED on 9/9 due to confusion and altered mental status. She had UTI recently, was treated with amoxicillin for 3 days and developed a rash. Then she was treated with Zithromax 1 dose. She was found to have confusion and poor oral intake for the 3 days prior to presentation.  In ED patient had fever of 100.4 and was treated with Vancomycin and Zosyn in the ED. New skin rash again noted and patient was admitted.    Past Medical History:  Diagnosis Date  . Atrial fibrillation (HCC)   . Dementia   . Hypertension   . Thyroid disease     Past Surgical History:  Procedure Laterality Date  . HIP SURGERY      Family History  Problem Relation Age of Onset  . Hypertension Son     Social History:  reports that she has never smoked. She has never used smokeless tobacco. She reports that she does not drink alcohol or use drugs.  Allergies  Allergen Reactions  . Amoxicillin     Other reaction(s): Unknown  . Iodine     Other reaction(s): Unknown  . Shellfish-Derived Products     Other reaction(s): Unknown    Medications:  I have reviewed the patient's current medications. Prior to Admission:  Prescriptions Prior to Admission  Medication Sig Dispense Refill Last Dose  . acetaminophen (TYLENOL) 500 MG tablet Take 1,000 mg by mouth every 8 (eight) hours.   03/11/2016 at Unknown time  . amitriptyline (ELAVIL) 10 MG tablet Take 20 mg by mouth at bedtime.   03/10/2016 at Unknown time  . azithromycin (ZITHROMAX) 250 MG tablet Take 250-500 mg by mouth See admin instructions. Take 2 tablets (500 mg) by mouth daily for 1 day, then take 2 tablets (500 mg) by mouth every day.   haven't started yet  . digoxin (LANOXIN) 0.125 MG tablet Take 0.125 mg by  mouth daily. *Hold if <60 BPM*   03/09/2016 at 1000  . diphenhydrAMINE (BENADRYL) 25 MG tablet Take 25 mg by mouth 2 (two) times daily as needed for allergies.   03/09/2016  . feeding supplement, ENSURE ENLIVE, (ENSURE ENLIVE) LIQD Take 237 mLs by mouth 3 (three) times daily with meals. 237 mL 12 03/10/2016 at Unknown time  . levothyroxine (SYNTHROID, LEVOTHROID) 75 MCG tablet Take 1 tablet by mouth daily.  0 03/11/2016 at 0630  . lisinopril (PRINIVIL,ZESTRIL) 30 MG tablet Take 30 mg by mouth every morning.   03/09/2016  . metoprolol succinate (TOPROL-XL) 25 MG 24 hr tablet Take 1 tablet (25 mg total) by mouth at bedtime. 30 tablet 0 03/10/2016 at 2000  . mirtazapine (REMERON) 15 MG tablet Take 15 mg by mouth at bedtime.   03/10/2016 at Unknown time  . POLYETHYLENE GLYCOL 3350 PO Take 17 g by mouth daily as needed. For constipation. *Mix with 8 ounces of fluid and drink*   unknown  . senna-docusate (SENOKOT-S) 8.6-50 MG tablet Take 2 tablets by mouth at bedtime.   03/10/2016 at Unknown time   Scheduled: . amitriptyline  20 mg Oral QHS  . aspirin  81 mg Oral Daily  . chlorhexidine  15 mL Mouth Rinse BID  . cloNIDine  0.1 mg Transdermal Weekly  . digoxin  0.25 mg Oral  Daily  . docusate sodium  100 mg Oral BID  . enoxaparin (LOVENOX) injection  30 mg Subcutaneous Q24H  . feeding supplement (ENSURE ENLIVE)  237 mL Oral TID WC  . levothyroxine  75 mcg Oral QAC breakfast  . mouth rinse  15 mL Mouth Rinse q12n4p  . mirtazapine  7.5 mg Oral QHS  . senna-docusate  2 tablet Oral QHS    ROS: Unable to provide due to mental status  Physical Examination: Blood pressure (!) 188/78, pulse 68, temperature 98.7 F (37.1 C), temperature source Oral, resp. rate 18, height 5' (1.524 m), weight 42.9 kg (94 lb 8 oz), SpO2 98 %.  HEENT-  Normocephalic, no lesions, without obvious abnormality.  Normal external eye and conjunctiva.  Normal TM's bilaterally.  Normal auditory canals and external ears. Normal external nose,  mucus membranes and septum.  Normal pharynx. Cardiovascular- S1, S2 normal, pulses palpable throughout   Lungs- chest clear, no wheezing, rales, normal symmetric air entry Abdomen- soft, non-tender; bowel sounds normal; no masses,  no organomegaly Extremities- no edema Lymph-no adenopathy palpable Musculoskeletal-no joint tenderness, deformity or swelling Skin-erythmatous rash noted uder arms and in groin creases bilaterally  Neurological Examination Mental Status: Lethargic but able to be awakened when name called.  Follows some commands but requires nonverbal cues.  Speech minimal but fluent.  Patient not cooperative Cranial Nerves: II: Discs flat bilaterally; Blinks to bilateral confrontation, pupils equal, round, reactive to light and accommodation III,IV, VI: ptosis not present, extra-ocular motions grossly intact bilaterally V,VII: smile symmetric, facial light touch sensation normal bilaterally VIII: hearing decreased bilaterally IX,X: gag reflex present XI: bilateral shoulder shrug XII: midline tongue extension Motor: Has 5/5 hand grip bilaterally.  Lifts both arms against gravity spontaneously.  No focal weakness noted.  Only keeps each leg lifted momentarily for lower extremity strength testing.   Sensory: Responds to light noxious stimuli throughout Deep Tendon Reflexes: 2+ and symmetric with absent AJ's bilaterally Plantars: Right: upgoing   Left: upgoing Cerebellar: Unable to perform due to mental status Gait: Unable to perform due to mental status    Laboratory Studies:   Basic Metabolic Panel:  Recent Labs Lab 03/11/16 1022 03/11/16 1449 03/12/16 0436 03/13/16 0439  NA 138  --  141 141  K 3.5  --  3.3* 3.1*  CL 101  --  108 105  CO2 29  --  26 25  GLUCOSE 125*  --  105* 88  BUN 33*  --  29* 24*  CREATININE 0.79  --  0.81 0.79  CALCIUM 9.1  --  8.5* 8.5*  MG  --  2.0  --   --     Liver Function Tests:  Recent Labs Lab 03/11/16 1022  AST 18  ALT  14  ALKPHOS 77  BILITOT 0.7  PROT 7.4  ALBUMIN 4.0   No results for input(s): LIPASE, AMYLASE in the last 168 hours. No results for input(s): AMMONIA in the last 168 hours.  CBC:  Recent Labs Lab 03/11/16 1022 03/12/16 0436 03/13/16 0439  WBC 10.2 11.4* 7.3  NEUTROABS 8.6*  --   --   HGB 11.8* 10.1* 10.9*  HCT 34.6* 29.5* 31.1*  MCV 94.7 94.1 95.3  PLT 128* 112* 120*    Cardiac Enzymes:  Recent Labs Lab 03/11/16 1022 03/11/16 1449  TROPONINI 0.05* 0.04*    BNP: Invalid input(s): POCBNP  CBG: No results for input(s): GLUCAP in the last 168 hours.  Microbiology: Results for orders placed or performed during  the hospital encounter of 03/11/16  Blood culture (routine x 2)     Status: None (Preliminary result)   Collection Time: 03/11/16 10:22 AM  Result Value Ref Range Status   Specimen Description BLOOD LEFT WRIST  Final   Special Requests BOTTLES DRAWN AEROBIC AND ANAEROBIC  2CC  Final   Culture NO GROWTH 2 DAYS  Final   Report Status PENDING  Incomplete  Blood culture (routine x 2)     Status: None (Preliminary result)   Collection Time: 03/11/16 10:22 AM  Result Value Ref Range Status   Specimen Description BLOOD LEFT FOREARM  Final   Special Requests BOTTLES DRAWN AEROBIC AND ANAEROBIC  1CC  Final   Culture NO GROWTH 2 DAYS  Final   Report Status PENDING  Incomplete  MRSA PCR Screening     Status: None   Collection Time: 03/11/16  3:19 PM  Result Value Ref Range Status   MRSA by PCR NEGATIVE NEGATIVE Final    Comment:        The GeneXpert MRSA Assay (FDA approved for NASAL specimens only), is one component of a comprehensive MRSA colonization surveillance program. It is not intended to diagnose MRSA infection nor to guide or monitor treatment for MRSA infections.     Coagulation Studies: No results for input(s): LABPROT, INR in the last 72 hours.  Urinalysis:  Recent Labs Lab 03/11/16 1022  COLORURINE YELLOW*  LABSPEC 1.015  PHURINE  6.0  GLUCOSEU NEGATIVE  HGBUR 2+*  BILIRUBINUR NEGATIVE  KETONESUR 1+*  PROTEINUR 30*  NITRITE NEGATIVE  LEUKOCYTESUR NEGATIVE    Lipid Panel:  No results found for: CHOL, TRIG, HDL, CHOLHDL, VLDL, LDLCALC  HgbA1C:  Lab Results  Component Value Date   HGBA1C 4.9 11/21/2015    Urine Drug Screen:  No results found for: LABOPIA, COCAINSCRNUR, LABBENZ, AMPHETMU, THCU, LABBARB  Alcohol Level: No results for input(s): ETH in the last 168 hours.  Other results: EKG: sinus rhythm at 69 bpm.  Imaging: No results found.   Assessment/Plan: 82 year old female with a history of afib and dementia who has altered mental status.  Has had recent infection and side effects to antibiotics.  Mental status may be metabolic, related to recent illnesses.  Would not expect mental status to return to baseline at the same time as lab work and cultures, will lag behind the numbers.  Patient does have a history of atrial fibrillation, not on anticoagulation.  Can not rule out a shower of emboli causing mental status change.  Head CT personally reviewed and shows no acute changes.  Further work up recommended.    Recommendations: 1.  MRI of the brain without contrast.    Thana Farr, MD Neurology [phone] 03/13/2016, 2:08 PM

## 2016-03-14 ENCOUNTER — Observation Stay (HOSPITAL_BASED_OUTPATIENT_CLINIC_OR_DEPARTMENT_OTHER): Payer: Medicare Other

## 2016-03-14 ENCOUNTER — Observation Stay: Payer: Medicare Other

## 2016-03-14 DIAGNOSIS — R4182 Altered mental status, unspecified: Secondary | ICD-10-CM | POA: Diagnosis not present

## 2016-03-14 DIAGNOSIS — E440 Moderate protein-calorie malnutrition: Secondary | ICD-10-CM | POA: Insufficient documentation

## 2016-03-14 NOTE — Progress Notes (Signed)
Initial Nutrition Assessment  DOCUMENTATION CODES:   Non-severe (moderate) malnutrition in context of chronic illness  INTERVENTION:  -Recommend changing diet to dysphagia 3 with no other restrictions.   -Agree with ensure enlive for added nutrition. -Pt may benefit from MVI being added to current regimen    NUTRITION DIAGNOSIS:   Malnutrition related to chronic illness as evidenced by moderate depletion of body fat, moderate depletions of muscle mass.    GOAL:   Patient will meet greater than or equal to 90% of their needs    MONITOR:   PO intake, Supplement acceptance  REASON FOR ASSESSMENT:    (low BMI)    ASSESSMENT:     Pt admitted with AMS, recently had UTI. Neurology following.    Pt with past medical history of afib, HTN, hypothyroidism, dementia.  Unable to gather information from pt, sleeping and unable to answer questions.  No family at bedside.  Noted per chart poor po intake for 3 days prior to admission.  Pt has been able to feed self.  Full breakfast tray at bedside untouched.  Medications reviewed: colace, remeron, NS with KCl at 750ml/hr, senokot-S Labs reviewed: K 3.1, BUN 24, creatinine 0.79  Nutrition-Focused physical exam completed. Findings are moderate in orbital region unable to assess other areas fat depletion, moderate to severe muscle depletion, and none edema.    Diet Order:  Diet Heart Room service appropriate? Yes; Fluid consistency: Thin  Skin:  Reviewed, no issues  Last BM:  9/9  Height:   Ht Readings from Last 1 Encounters:  03/11/16 5' (1.524 m)    Weight: Noted wt in 08/2015 admission of 102 lb (8% wt loss in the last 7 months)  Wt Readings from Last 1 Encounters:  03/11/16 94 lb 8 oz (42.9 kg)    Ideal Body Weight:     BMI:  Body mass index is 18.46 kg/m.  Estimated Nutritional Needs:   Kcal:  1290-1680 kcals/d  Protein:  58-72 g/d  Fluid:  >/=135800ml/d  EDUCATION NEEDS:   No education needs  identified at this time  Arryana Tolleson B. Freida BusmanAllen, RD, LDN [phone] (pager) Weekend/On-Call pager [phone])

## 2016-03-14 NOTE — Progress Notes (Signed)
CSW spoke to Melanie Tanner- APS Case Worker. Per Ms. [name] patient's daughter Melanie Tanner is patient's new Legal Guardian. Reported Guardianship was transferred on December 14, 2015.  CSW spoke to patient's daughter. She faxed Guardianship paperwork to 1C and it was placed on chart. CSW spoke to Investment banker, corporateBonnie- Administrator at [facility]. She reports that she is willing to accomodate patient with Hosp[facility] services [facility]. CSW informed patient's daughter of above. Patient's daughter would like to talk to Palliative Care about goals of care. P.Care notified. CSW will continue to follow and assist.  [name], MSW, LCSW, LCAS-A Clinical Social Worker [phone]

## 2016-03-14 NOTE — Progress Notes (Signed)
Subjective: Patient unchanged from yesterday.  Remains lethargic.  No new complaints.    Objective: Current vital signs: BP (!) 142/48 (BP Location: Right Arm)   Pulse 78   Temp 98.4 F (36.9 C) (Oral)   Resp 20   Ht 5' (1.524 m)   Wt 42.9 kg (94 lb 8 oz)   SpO2 99%   BMI 18.46 kg/m  Vital signs in last 24 hours: Temp:  [98.1 F (36.7 C)-98.7 F (37.1 C)] 98.4 F (36.9 C) (09/12 0432) Pulse Rate:  [68-87] 78 (09/12 0520) Resp:  [18-24] 20 (09/12 0432) BP: (142-188)/(48-113) 142/48 (09/12 0520) SpO2:  [96 %-99 %] 99 % (09/12 0432)  Intake/Output from previous day: 09/11 0701 - 09/12 0700 In: 1739.2 [I.V.:1739.2] Out: -  Intake/Output this shift: No intake/output data recorded. Nutritional status: Diet Heart Room service appropriate? Yes; Fluid consistency: Thin  Neurologic Exam: Mental Status: Lethargic but able to be awakened when name called.  Follows some commands but requires nonverbal cues.  Speech minimal but fluent.  Patient not cooperative Cranial Nerves: II: Discs flat bilaterally; Blinks to bilateral confrontation, pupils equal, round, reactive to light and accommodation III,IV, VI: ptosis not present, extra-ocular motions grossly intact bilaterally V,VII: smile symmetric, facial light touch sensation normal bilaterally VIII: hearing decreased bilaterally IX,X: gag reflex present XI: bilateral shoulder shrug XII: midline tongue extension Motor: Has 5/5 hand grip bilaterally.  Lifts both arms against gravity spontaneously.  No focal weakness noted.  Only keeps each leg lifted momentarily for lower extremity strength testing.    Lab Results: Basic Metabolic Panel:  Recent Labs Lab 03/11/16 1022 03/11/16 1449 03/12/16 0436 03/13/16 0439  NA 138  --  141 141  K 3.5  --  3.3* 3.1*  CL 101  --  108 105  CO2 29  --  26 25  GLUCOSE 125*  --  105* 88  BUN 33*  --  29* 24*  CREATININE 0.79  --  0.81 0.79  CALCIUM 9.1  --  8.5* 8.5*  MG  --  2.0  --   --      Liver Function Tests:  Recent Labs Lab 03/11/16 1022  AST 18  ALT 14  ALKPHOS 77  BILITOT 0.7  PROT 7.4  ALBUMIN 4.0   No results for input(s): LIPASE, AMYLASE in the last 168 hours. No results for input(s): AMMONIA in the last 168 hours.  CBC:  Recent Labs Lab 03/11/16 1022 03/12/16 0436 03/13/16 0439  WBC 10.2 11.4* 7.3  NEUTROABS 8.6*  --   --   HGB 11.8* 10.1* 10.9*  HCT 34.6* 29.5* 31.1*  MCV 94.7 94.1 95.3  PLT 128* 112* 120*    Cardiac Enzymes:  Recent Labs Lab 03/11/16 1022 03/11/16 1449  TROPONINI 0.05* 0.04*    Lipid Panel: No results for input(s): CHOL, TRIG, HDL, CHOLHDL, VLDL, LDLCALC in the last 168 hours.  CBG: No results for input(s): GLUCAP in the last 168 hours.  Microbiology: Results for orders placed or performed during the hospital encounter of 03/11/16  Blood culture (routine x 2)     Status: None (Preliminary result)   Collection Time: 03/11/16 10:22 AM  Result Value Ref Range Status   Specimen Description BLOOD LEFT WRIST  Final   Special Requests BOTTLES DRAWN AEROBIC AND ANAEROBIC  2CC  Final   Culture NO GROWTH 3 DAYS  Final   Report Status PENDING  Incomplete  Blood culture (routine x 2)     Status: None (Preliminary  result)   Collection Time: 03/11/16 10:22 AM  Result Value Ref Range Status   Specimen Description BLOOD LEFT FOREARM  Final   Special Requests BOTTLES DRAWN AEROBIC AND ANAEROBIC  1CC  Final   Culture NO GROWTH 3 DAYS  Final   Report Status PENDING  Incomplete  MRSA PCR Screening     Status: None   Collection Time: 03/11/16  3:19 PM  Result Value Ref Range Status   MRSA by PCR NEGATIVE NEGATIVE Final    Comment:        The GeneXpert MRSA Assay (FDA approved for NASAL specimens only), is one component of a comprehensive MRSA colonization surveillance program. It is not intended to diagnose MRSA infection nor to guide or monitor treatment for MRSA infections.     Coagulation Studies: No  results for input(s): LABPROT, INR in the last 72 hours.  Imaging: No results found.  Medications:  I have reviewed the patient's current medications. Scheduled: . amitriptyline  20 mg Oral QHS  . aspirin  81 mg Oral Daily  . chlorhexidine  15 mL Mouth Rinse BID  . cloNIDine  0.1 mg Transdermal Weekly  . digoxin  0.125 mg Oral Daily  . docusate sodium  100 mg Oral BID  . enoxaparin (LOVENOX) injection  30 mg Subcutaneous Q24H  . feeding supplement (ENSURE ENLIVE)  237 mL Oral TID WC  . levothyroxine  75 mcg Oral QAC breakfast  . lisinopril  20 mg Oral Daily  . mouth rinse  15 mL Mouth Rinse q12n4p  . metoprolol tartrate  25 mg Oral BID  . mirtazapine  7.5 mg Oral QHS  . senna-docusate  2 tablet Oral QHS    Assessment/Plan: Patient unchanged. Patient remains lethargic.  On Remeron and Elavil which may be potentiating this.  MRI of the brain pending.    Recommendations: 1.  D/C Remeron and Elavil.     LOS: 0 days   Thana Farr, MD Neurology [phone] 03/14/2016  10:51 AM

## 2016-03-14 NOTE — Care Management (Signed)
Admitted to [facility] under observation status with diagnosis of altered mental status. A resident of [facility]. Daughter is Marygrace DroughtSusan Humphrey [phone] Dr. Yates DecampJohn Walker is listed as primary care physician. [facility]charged [facility] facility to [facility]. Fallen in the past. Uses a cane to aid in ambulation in the past.  Telephone call to daughter Lavera GuiseSusan Humphey to discuss Hospice agencies. States she would like to know if Kendal HymenBonnie would be willing to take her mother back to [facility] with Hospices services 1st before she decides on an agency.  Ms. Andee PolesHumphrey has many questions that could not be answered at this time. Would like talk to Palliative Care representative about goals of care for her mother. Spoke with Lorinda CreedMary Larach NP for palliative care. Will call daughter in the morning to discuss goals of care for her mother 1st thing. Ms. Andee PolesHumphrey states she will be available. Gwenette GreetBrenda S Makaleigh Reinard RN MSN CCM Care Management [phone]

## 2016-03-14 NOTE — Plan of Care (Signed)
Problem: Education: Goal: Knowledge of [facility] Education information/materials will improve Outcome: Progressing Spoke with family this evening.

## 2016-03-14 NOTE — Progress Notes (Signed)
[facility] - [place] at [facility]   PATIENT NAME: Melanie RecordsMargaret Tanner    MR#:  [id number]  DATE OF BIRTH:  03/07/1922  SUBJECTIVE:  CHIEF COMPLAINT:   Chief Complaint  Patient presents with  . Altered Mental Status   - appears more lethargic today, moaning in pain as soon as you touch, not following commands - MRI negative  REVIEW OF SYSTEMS:  Review of Systems  Unable to perform ROS: Mental status change    DRUG ALLERGIES:   Allergies  Allergen Reactions  . Amoxicillin     Other reaction(s): Unknown  . Iodine     Other reaction(s): Unknown  . Shellfish-Derived Products     Other reaction(s): Unknown    VITALS:  Blood pressure (!) 142/58, pulse 64, temperature 98.6 F (37 C), temperature source Oral, resp. rate 18, height 5' (1.524 m), weight 42.9 kg (94 lb 8 oz), SpO2 99 %.  PHYSICAL EXAMINATION:  Physical Exam  GENERAL:  82 y.o.-year-old patient lying in the bed with no acute distress. But moans as soon as you touch her EYES: Pupils equal, round, reactive to light and accommodation. No scleral icterus.  HEENT: Head atraumatic, normocephalic. Oropharynx and nasopharynx clear.  NECK:  Supple, no jugular venous distention. No thyroid enlargement, no tenderness.  LUNGS: Normal breath sounds bilaterally, no wheezing, rales,rhonchi or crepitation. No use of accessory muscles of respiration. Decreased bibasilar breath sounds CARDIOVASCULAR: S1, S2 normal. No  rubs, or gallops. 2/6 systolic murmur is present ABDOMEN: Soft, nontender, nondistended. Bowel sounds present. No organomegaly or mass.  EXTREMITIES: No pedal edema, cyanosis, or clubbing.  NEUROLOGIC: Appears globally weak. Not following commands. Moaning in pain  Unable to do a complete neuro exam. PSYCHIATRIC: The patient is lethargic, but easily gets aroused, not following commands, non verbal, moaning   SKIN: fading macular rash in both axilla tracing to lower back, also noted in suprapubic  region.   LABORATORY PANEL:   CBC  Recent Labs Lab 03/13/16 0439  WBC 7.3  HGB 10.9*  HCT 31.1*  PLT 120*   ------------------------------------------------------------------------------------------------------------------  Chemistries   Recent Labs Lab 03/11/16 1022 03/11/16 1449  03/13/16 0439  NA 138  --   < > 141  K 3.5  --   < > 3.1*  CL 101  --   < > 105  CO2 29  --   < > 25  GLUCOSE 125*  --   < > 88  BUN 33*  --   < > 24*  CREATININE 0.79  --   < > 0.79  CALCIUM 9.1  --   < > 8.5*  MG  --  2.0  --   --   AST 18  --   --   --   ALT 14  --   --   --   ALKPHOS 77  --   --   --   BILITOT 0.7  --   --   --   < > = values in this interval not displayed. ------------------------------------------------------------------------------------------------------------------  Cardiac Enzymes  Recent Labs Lab 03/11/16 1449  TROPONINI 0.04*   ------------------------------------------------------------------------------------------------------------------  RADIOLOGY:  Mr Brain Wo Contrast  Result Date: 03/14/2016 CLINICAL DATA:  History of hypertension, and dementia, with confusion and altered mental status. Recent UTI. EXAM: MRI HEAD WITHOUT CONTRAST TECHNIQUE: Multiplanar, multiecho pulse sequences of the brain and surrounding structures were obtained without intravenous contrast. COMPARISON:  CT head 03/11/2016. FINDINGS: Brain: No acute infarction, hemorrhage, extra-axial collection or  mass lesion. Advanced atrophy with hydrocephalus ex vacuo. Extensive T2 and FLAIR hyperintensities throughout the white matter consistent with chronic microvascular ischemic change. Vascular: Flow voids are maintained throughout the carotid, basilar, and vertebral arteries. Susceptibility focus involving the LEFT cerebellar tonsil, difficult to see on T1 and T2 weighted images, could represent sequelae of small cavernoma or remote infarct. Skull and upper cervical spine: Unremarkable  visualized calvarium, skullbase, and cervical vertebrae. Pituitary, pineal, cerebellar tonsils unremarkable. No upper cervical cord lesions. Sinuses/Orbits: No orbital masses or proptosis. Globes appear symmetric, except for RIGHT cataract extraction. Sinuses appear well aerated, without evidence for air-fluid level. Other: BILATERAL mastoid effusions, non worrisome. No nasopharyngeal process appear Compared with prior CT, good general agreement. No evidence for acute hemorrhage on prior CT. IMPRESSION: Advanced atrophy and small vessel disease similar to priors. No acute intracranial findings. Electronically Signed   By: Elsie Stain M.D.   On: 03/14/2016 12:15    EKG:   Orders placed or performed during the hospital encounter of 03/11/16  . EKG 12-Lead  . EKG 12-Lead    ASSESSMENT AND PLAN:   82 year old female with past medical history significant for A. Fib, hypertension, hypothyroidism, dementia presents to the hospital secondary to altered mental status.  #1 altered mental status-metabolic encephalopathy. -No sedating medications. Appreciate neurology consult. MRI with brain atrophy, but no acute findings.  - No focal deficits are noted.Appreciate neurology consult. -Several admissions in UTI recently. Patient has been going downhill ever since. Likely worsening dementia and end of life. -Discussed with the daughter, son and also granddaughter. Everybody in agreement with hospice services. We'll check with care manager to see if hosp[facility] can be arranged at [facility]. - urine without any bacteria now. No further fevers at this time. Off of any antibiotics  #2 Rash- likely drug rash- fading now No benadryl as made her very drowsy  #3 hypokalemia-receiving potassium  IV fluids. Continue fluids for today  #4 Hypertension-due to her mental status, unable to take any oral medications. Lisinopril and metoprolol have been ordered but patient unable to take them... Started on clonidine patch  and IV hydralazine as needed.  #5 atrial fibrillation-rate controlled. On digoxin. Not on any anticoagulation other than aspirin  #6 DVT  Prophylaxis-Lovenox  Possible [facility] to [facility] with hosp[facility] services tomorrow.    All the Tanner are reviewed and case discussed with Care Management/Social Workerr. Management plans discussed with the patient, family and they are in agreement.  CODE STATUS: DO NOT RESUSCITATE  TOTAL TIME TAKING CARE OF THIS PATIENT: 42 minutes.   POSSIBLE D/C TOMORROW, DEPENDING ON CLINICAL CONDITION.   Enid Baas M.D on 03/14/2016 at 1:41 PM  Between 7am to 6pm - Pager - [phone]  After 6pm go to [email or web address] - password [employer]  [facility] [place] [facility]  Office  [phone]  CC: Primary care physician; Pcp Not In System

## 2016-03-15 DIAGNOSIS — Z515 Encounter for palliative care: Secondary | ICD-10-CM

## 2016-03-15 DIAGNOSIS — Z66 Do not resuscitate: Secondary | ICD-10-CM

## 2016-03-15 DIAGNOSIS — Z789 Other specified health status: Secondary | ICD-10-CM

## 2016-03-15 DIAGNOSIS — R627 Adult failure to thrive: Secondary | ICD-10-CM

## 2016-03-15 DIAGNOSIS — R4182 Altered mental status, unspecified: Secondary | ICD-10-CM | POA: Diagnosis not present

## 2016-03-15 MED ORDER — MORPHINE SULFATE (CONCENTRATE) 10 MG/0.5ML PO SOLN: 5.0000 mg | ORAL | Status: DC | PRN

## 2016-03-15 MED ORDER — MORPHINE SULFATE (CONCENTRATE) 10 MG/0.5ML PO SOLN
5.0000 mg | Freq: Three times a day (TID) | ORAL | Status: DC
  Administered 2016-03-15 (×2): 5 mg via ORAL
  Filled 2016-03-15 (×2): qty 1

## 2016-03-15 MED ORDER — LORAZEPAM 1 MG PO TABS: 1.0000 mg | ORAL_TABLET | ORAL | Status: DC | PRN

## 2016-03-15 MED ORDER — MORPHINE SULFATE (CONCENTRATE) 10 MG/0.5ML PO SOLN
5.0000 mg | ORAL | Status: DC | PRN
  Administered 2016-03-15: 5 mg via ORAL
  Filled 2016-03-15: qty 1

## 2016-03-15 MED ORDER — MORPHINE SULFATE (CONCENTRATE) 10 MG/0.5ML PO SOLN: 5.0000 mg | Freq: Three times a day (TID) | ORAL | 0 refills | Status: AC

## 2016-03-15 MED ORDER — ACETAMINOPHEN 650 MG RE SUPP: 650.0000 mg | Freq: Four times a day (QID) | RECTAL | 0 refills | Status: AC | PRN

## 2016-03-15 MED ORDER — MORPHINE SULFATE (CONCENTRATE) 10 MG/0.5ML PO SOLN: 5.0000 mg | ORAL | 0 refills | Status: AC | PRN

## 2016-03-15 MED ORDER — BISACODYL 10 MG RE SUPP
10.0000 mg | Freq: Every day | RECTAL | Status: DC | PRN
Start: 2016-03-15 — End: 2016-03-15

## 2016-03-15 MED ORDER — CLONIDINE HCL 0.2 MG/24HR TD PTWK: 0.2000 mg | MEDICATED_PATCH | TRANSDERMAL | 0 refills | Status: AC

## 2016-03-15 NOTE — Progress Notes (Signed)
Clinical Social Worker was informed that patient will be medically ready to discharge to [facility]. Patient's daughter is in a agreement with plan. CSW called Kendal HymenBonnie- Production designer, theatre/television/filmAdministrator [facility]. All discharge information faxed to [facility] via H[facility]. DNR added to discharge packet.   Call to patient's daughter,informed her patient would discharge to [facility]. Patient will discharge to [facility] via EMS.  Woodroe Modehristina Aland Chestnutt, MSW, LCSW, LCAS-A Clinical Social Worker [phone]

## 2016-03-15 NOTE — Progress Notes (Signed)
EMS contacted for transport.  Pt ready to transport.  IVs removed.  CSW updated family and sent paperwork to facility.  Orson Apeanielle Saintclair Schroader, RN

## 2016-03-15 NOTE — Progress Notes (Signed)
CSW left voicmail for Bonnie-[facility] Administrator to inform her that patient will discharge today. CSW will update FL2 discharge medications when MD completes discharge summary. CSW will continue to follow and assist.  Woodroe Modehristina Shawna Wearing, MSW, LCSW, LCAS-A Clinical Social Worker [phone]

## 2016-03-15 NOTE — Progress Notes (Signed)
Pt transported to [facility] via EMS.

## 2016-03-15 NOTE — Care Management (Addendum)
Discussed Hospice agencies with daughter, Marygrace DroughtSusan Humphrey, yesterday regarding hospice agencies. [facility]. Dayna BarkerKaren Robertson, RN representative for [facility] updated.Transportation will be arranged through [facility]                                                    Discharge to [facility] today per Dr. Nemiah CommanderKalisetti. Gwenette GreetBrenda S Sharmarke Cicio RN MSN CCM Care Management [phone]

## 2016-03-15 NOTE — Consult Note (Signed)
Consultation Note Date: 03/15/2016   Patient Name: Melanie Tanner  DOB: December 20, 1921  MRN: [id number]  Age / Sex: 82 y.o., female  PCP: Pcp Not In System Referring Physician: Enid Baas, MD  Reason for Consultation: Establishing goals of care, Non pain symptom management, Pain control and Psychosocial/spiritual support  HPI/Patient Profile: 82 y.o. female   admitted on 03/11/2016 with known history of Hypertension, A. fib, thyroid disease and dementia.   Patient was sent from nursing home to the ED due to confusion and altered mental status.   She had UTI recently, was treated with amoxicillin for 3 days and developed a rash. Then she was treated with Zithromax 1 dose. She was found to have confusion and poor oral intake for the past 3 days. Since patient has fever 100.4, she was treated with vancomycin and Zosyn in the ED.   Per family continued physical, functional and cognitive decline.  They face advanced directive decisions and anticipatory care needs.   Clinical Assessment and Goals of Care:    This NP Lorinda Creed reviewed medical records, received report from team, assessed the patient and then spoke to patient's daughter Marygrace Drought by telephone  to discuss diagnosis, prognosis, GOC, EOL wishes disposition and options.  A detailed discussion was had today regarding advanced directives.  Concepts specific to code status, artifical feeding and hydration, continued IV antibiotics and rehospitalization was had.  The difference between a aggressive medical intervention path  and a palliative comfort care path for this patient at this time was had.  Values and goals of care important to patient and family were attempted to be elicited.  Concepts of Hospice and Palliative Care were discussed  Natural trajectory and expectations at EOL were discussed.  Questions and concerns addressed.   Family  encouraged to call with questions or concerns.  PMT will continue to support holistically.  Daughter tells me she is going out of the country today, she understands her mother may die at any time.  She is hopeful for good EOL care, focus is comfort at the facility where her mother has been being cared for prior to this admission/[facility].  They agree to take her back for EOL care with hospice services.  Daughter can best be reached at this email: [email or web address]  Her brother can be reached at: Yasmeen Manka # [phone]   SUMMARY OF RECOMMENDATIONS    Code Status/Advance Care Planning:  DNR   Symptom Management:   Dyspnea: Roxanol 5 mg po/sl every 8 hrs scheduled                       Roxanol 5 mg po/sl every 1 hr prn  Agitation: Ativan 1 mg po/sl every 6 hrs prn  Palliative Prophylaxis:   Aspiration, Bowel Regimen, Frequent Pain Assessment and Oral Care  Additional Recommendations (Limitations, Scope, Preferences):  Full Comfort Care  Psycho-social/Spiritual:   Desire for further Chaplaincy support:no  Additional Recommendations: Education on Hospice  Prognosis:   < 4 weeks  Discharge Planning: Home with Home Health      Primary Diagnoses: Present on Admission: . Altered mental status   I have reviewed the medical record, interviewed the patient and family, and examined the patient. The following aspects are pertinent.  Past Medical History:  Diagnosis Date  . Atrial fibrillation (HCC)   . Dementia   . Hypertension   . Thyroid disease    Social History   Social History  . Marital status: Married    Spouse name: N/A  . Number of children: N/A  . Years of education: N/A   Occupational History  . retired    Social History Main Topics  . Smoking status: Never Smoker  . Smokeless tobacco: Never Used  . Alcohol use No  . Drug use: No  . Sexual activity: No   Other Topics Concern  . None   Social History Narrative  . None    Family History  Problem Relation Age of Onset  . Hypertension Son    Scheduled Meds: . aspirin  81 mg Oral Daily  . chlorhexidine  15 mL Mouth Rinse BID  . cloNIDine  0.1 mg Transdermal Weekly  . digoxin  0.125 mg Oral Daily  . docusate sodium  100 mg Oral BID  . enoxaparin (LOVENOX) injection  30 mg Subcutaneous Q24H  . feeding supplement (ENSURE ENLIVE)  237 mL Oral TID WC  . levothyroxine  75 mcg Oral QAC breakfast  . lisinopril  20 mg Oral Daily  . mouth rinse  15 mL Mouth Rinse q12n4p  . metoprolol tartrate  25 mg Oral BID  . senna-docusate  2 tablet Oral QHS   Continuous Infusions: . 0.9 % NaCl with KCl 20 mEq / L 50 mL/hr at 03/15/16 0503   PRN Meds:.acetaminophen **OR** acetaminophen, albuterol, hydrALAZINE, ondansetron **OR** ondansetron (ZOFRAN) IV, polyethylene glycol, senna-docusate Medications Prior to Admission:  Prior to Admission medications   Medication Sig Start Date End Date Taking? Authorizing Provider  acetaminophen (TYLENOL) 500 MG tablet Take 1,000 mg by mouth every 8 (eight) hours.   Yes Historical Provider, MD  amitriptyline (ELAVIL) 10 MG tablet Take 20 mg by mouth at bedtime.   Yes Historical Provider, MD  azithromycin (ZITHROMAX) 250 MG tablet Take 250-500 mg by mouth See admin instructions. Take 2 tablets (500 mg) by mouth daily for 1 day, then take 2 tablets (500 mg) by mouth every day. 03/10/16 03/15/16 Yes Historical Provider, MD  digoxin (LANOXIN) 0.125 MG tablet Take 0.125 mg by mouth daily. *Hold if <60 BPM*   Yes Historical Provider, MD  diphenhydrAMINE (BENADRYL) 25 MG tablet Take 25 mg by mouth 2 (two) times daily as needed for allergies.   Yes Historical Provider, MD  feeding supplement, ENSURE ENLIVE, (ENSURE ENLIVE) LIQD Take 237 mLs by mouth 3 (three) times daily with meals. 08/24/15  Yes Alford Highlandichard Wieting, MD  levothyroxine (SYNTHROID, LEVOTHROID) 75 MCG tablet Take 1 tablet by mouth daily. 08/13/15  Yes Historical Provider, MD  lisinopril  (PRINIVIL,ZESTRIL) 30 MG tablet Take 30 mg by mouth every morning.   Yes Historical Provider, MD  metoprolol succinate (TOPROL-XL) 25 MG 24 hr tablet Take 1 tablet (25 mg total) by mouth at bedtime. 08/24/15  Yes Alford Highlandichard Wieting, MD  mirtazapine (REMERON) 15 MG tablet Take 15 mg by mouth at bedtime.   Yes Historical Provider, MD  POLYETHYLENE GLYCOL 3350 PO Take 17 g by mouth daily as needed. For constipation. *Mix with 8 ounces of fluid and drink*   Yes  Historical Provider, MD  senna-docusate (SENOKOT-S) 8.6-50 MG tablet Take 2 tablets by mouth at bedtime.   Yes Historical Provider, MD   Allergies  Allergen Reactions  . Amoxicillin     Other reaction(s): Unknown  . Iodine     Other reaction(s): Unknown  . Shellfish-Derived Products     Other reaction(s): Unknown   Review of Systems  Unable to perform ROS: Dementia    Physical Exam  Constitutional: She appears lethargic. She appears cachectic. She appears ill.  Cardiovascular: Normal rate, regular rhythm and normal heart sounds.   Pulmonary/Chest: She has decreased breath sounds in the right lower field and the left lower field.  Abdominal: Soft. Normal appearance.  Musculoskeletal:  -generalized weakness and atrophy  Neurological: She appears lethargic.  Skin: Skin is warm and dry.    Vital Signs: BP (!) 161/53 (BP Location: Right Arm)   Pulse 73   Temp 98 F (36.7 C)   Resp 17   Ht 5' (1.524 m)   Wt 42.9 kg (94 lb 8 oz)   SpO2 97%   BMI 18.46 kg/m  Pain Assessment: PAINAD   Pain Score: Asleep   SpO2: SpO2: 97 % O2 Device:SpO2: 97 % O2 Flow Rate: .   IO: Intake/output summary:  Intake/Output Summary (Last 24 hours) at 03/15/16 0614 Last data filed at 03/15/16 0503  Gross per 24 hour  Intake          1396.66 ml  Output                0 ml  Net          1396.66 ml    LBM: Last BM Date: 03/11/16 Baseline Weight: Weight: 46.3 kg (102 lb) Most recent weight: Weight: 42.9 kg (94 lb 8 oz)      Palliative  Assessment/Data: 30 % at best   Discussed with Dr Nemiah Commander  Time In: 0715 Time Out: 0815 Time Total: 60 min Greater than 50%  of this time was spent counseling and coordinating care related to the above assessment and plan.  Signed by: Lorinda Creed, NP   Please contact Palliative Medicine Team phone at [phone] for questions and concerns.  For individual provider: See Loretha Stapler

## 2016-03-15 NOTE — Progress Notes (Signed)
[facility] - [place] [facility]   PATIENT NAME: Melanie RecordsMargaret Tanner    MR#:  [id number]  DATE OF BIRTH:  12/04/1921  SUBJECTIVE:  CHIEF COMPLAINT:   Chief Complaint  Patient presents with  . Altered Mental Status   - appears more lethargic, moaning in pain as soon as you touch, not following commands - MRI negative - no significant change from ey[place]day  REVIEW OF SYSTEMS:  Review of Systems  Unable to perform ROS: Mental status change    DRUG ALLERGIES:   Allergies  Allergen Reactions  . Amoxicillin     Other reaction(s): Unknown  . Iodine     Other reaction(s): Unknown  . Shellfish-Derived Products     Other reaction(s): Unknown    VITALS:  Blood pressure (!) 189/65, pulse 72, temperature 98.9 F (37.2 C), temperature source Oral, resp. rate 15, height 5' (1.524 m), weight 42.9 kg (94 lb 8 oz), SpO2 97 %.  PHYSICAL EXAMINATION:  Physical Exam  GENERAL:  82 y.o.-year-old patient lying in the bed with no acute distress. But moans as soon as you touch her EYES: Pupils equal, round, reactive to light and accommodation. No scleral icterus.  HEENT: Head atraumatic, normocephalic. Oropharynx and nasopharynx clear.  NECK:  Supple, no jugular venous distention. No thyroid enlargement, no tenderness.  LUNGS: Normal breath sounds bilaterally, no wheezing, rales,rhonchi or crepitation. No use of accessory muscles of respiration. Decreased bibasilar breath sounds CARDIOVASCULAR: S1, S2 normal. No  rubs, or gallops. 2/6 systolic murmur is present ABDOMEN: Soft, nontender, nondistended. Bowel sounds present. No organomegaly or mass.  EXTREMITIES: No pedal edema, cyanosis, or clubbing.  NEUROLOGIC: Appears globally weak. Not following commands. Moaning in pain  Unable to do a complete neuro exam. PSYCHIATRIC: The patient is lethargic, but easily gets aroused, not following commands, non verbal, moaning   SKIN: fading macular rash in both axilla tracing to lower  back, also noted in suprapubic region.   LABORATORY PANEL:   CBC  Recent Labs Lab 03/13/16 0439  WBC 7.3  HGB 10.9*  HCT 31.1*  PLT 120*   ------------------------------------------------------------------------------------------------------------------  Chemistries   Recent Labs Lab 03/11/16 1022 03/11/16 1449  03/13/16 0439  NA 138  --   < > 141  K 3.5  --   < > 3.1*  CL 101  --   < > 105  CO2 29  --   < > 25  GLUCOSE 125*  --   < > 88  BUN 33*  --   < > 24*  CREATININE 0.79  --   < > 0.79  CALCIUM 9.1  --   < > 8.5*  MG  --  2.0  --   --   AST 18  --   --   --   ALT 14  --   --   --   ALKPHOS 77  --   --   --   BILITOT 0.7  --   --   --   < > = values in this interval not displayed. ------------------------------------------------------------------------------------------------------------------  Cardiac Enzymes  Recent Labs Lab 03/11/16 1449  TROPONINI 0.04*   ------------------------------------------------------------------------------------------------------------------  RADIOLOGY:  Mr Brain Wo Contrast  Result Date: 03/14/2016 CLINICAL DATA:  History of hypertension, and dementia, with confusion and altered mental status. Recent UTI. EXAM: MRI HEAD WITHOUT CONTRAST TECHNIQUE: Multiplanar, multiecho pulse sequences of the brain and surrounding structures were obtained without intravenous contrast. COMPARISON:  CT head 03/11/2016. FINDINGS: Brain: No acute  infarction, hemorrhage, extra-axial collection or mass lesion. Advanced atrophy with hydrocephalus ex vacuo. Extensive T2 and FLAIR hyperintensities throughout the white matter consistent with chronic microvascular ischemic change. Vascular: Flow voids are maintained throughout the carotid, basilar, and vertebral arteries. Susceptibility focus involving the LEFT cerebellar tonsil, difficult to see on T1 and T2 weighted images, could represent sequelae of small cavernoma or remote infarct. Skull and upper  cervical spine: Unremarkable visualized calvarium, skullbase, and cervical vertebrae. Pituitary, pineal, cerebellar tonsils unremarkable. No upper cervical cord lesions. Sinuses/Orbits: No orbital masses or proptosis. Globes appear symmetric, except for RIGHT cataract extraction. Sinuses appear well aerated, without evidence for air-fluid level. Other: BILATERAL mastoid effusions, non worrisome. No nasopharyngeal process appear Compared with prior CT, good general agreement. No evidence for acute hemorrhage on prior CT. IMPRESSION: Advanced atrophy and small vessel disease similar to priors. No acute intracranial findings. Electronically Signed   By: Elsie Stain M.D.   On: 03/14/2016 12:15    EKG:   Orders placed or performed during the hospital encounter of 03/11/16  . EKG 12-Lead  . EKG 12-Lead    ASSESSMENT AND PLAN:   82 year old female with past medical history significant for A. Fib, hypertension, hypothyroidism, dementia presents to the hospital secondary to altered mental status.  #1 altered mental status-metabolic encephalopathy. -No sedating medications. Appreciate neurology consult. MRI with brain atrophy, but no acute findings.  - No focal deficits are noted.Appreciate neurology consult. -Several admissions in UTI recently. Patient has been going downhill ever since. Likely worsening dementia and end of life. -Discussed with the daughter, son and also granddaughter. Everybody in agreement with hospice services.  - awaiting palliative care input. - urine without any bacteria now. No further fevers at this time. Off of any antibiotics  #2 Rash- likely drug rash- fading now No benadryl as made her very drowsy  #3 hypokalemia- as steering towards hospice- did not order labs today  #4 Hypertension-due to her mental status, unable to take any oral medications. Lisinopril and metoprolol have been ordered but patient unable to take them... Started on clonidine patch and IV  hydralazine as needed.  #5 atrial fibrillation-rate controlled. On digoxin. Not on any anticoagulation other than aspirin  #6 DVT  Prophylaxis-Lovenox  [facility] with hosp[facility] services today. Will change to comfort meds    All the Tanner are reviewed and case discussed with Care Management/Social Workerr. Management plans discussed with the patient, family and they are in agreement.  CODE STATUS: DO NOT RESUSCITATE  TOTAL TIME TAKING CARE OF THIS PATIENT: 42 minutes.   POSSIBLE D/C TODAY, DEPENDING ON CLINICAL CONDITION.   Enid Baas M.D on 03/15/2016 at 9:15 AM  Between 7am to 6pm - Pager - [phone]  After 6pm go to [email or web address] - password [employer]  [facility] [place] [facility]  Office  [phone]  CC: Primary care physician; Pcp Not In System

## 2016-03-15 NOTE — Progress Notes (Signed)
New referral for [facility] [place] [facility] at [facility] following [facility] a  Palliative Medicine consult. Ms. Melanie Tanner is 82 year old woman with a known history of dementia, HTN and A fib [facility] for evaluation of altered mental status, she was being treated for a UTI and developed a rash, antibiotic was changed, patient remained confused with poor oral intake. She was treated with IV antibiotics in the ED d/t a fever and rash appeared again. She remained confused and Neurology was consulted. Head CT negative for acute changes, neurology recommended MRI, again no acute changes. Neurology felt that d/t patient's afib and not being on any anti coagulant the mental status changes could possibly be attributed to "a shower of emboli". Patient has continued with poor appetite, taking only bites, refusing medications. She is nonambulatory, minimally verbal, incontinent of bowel and bladder. Patient's daughter Melanie Tanner has spoken with palliative Medicine Np Lorinda Creedmary Larach and has chosen for her mother to return to [facility] with hospice services. Writer contacted Melanie Tanner via telephone [phone]) to initiate education regarding hospice services, philosophy and team approach to care with good understanding voiced. Melanie Tanner will be leaving today (9/13) for an extended trip and has requested that her brother Melanie Tanner be the contact for hospice until she returns. Plan is for discharge today back to [facility], per Melanie Tanner patient has a hospital bed in place. Patient information faxed to hospice referral. Hospital care team aware of and in agreement with discharge plan. Patiently receiving liquid morphine 5 mg sl/po q 8 hrs with a  PRN dose of 5 mg Q 1 hr PRN. Per staff report she calls out in pain with any touch or movement. Prescriptions and signed DNR to accompany patient. Thank you for the opportunity to be involved in the care of this  patient and her family.  Dayna BarkerKaren Robertson RN, BSN, [facility] [place], [facility] Liaison [phone] c

## 2016-03-15 NOTE — Discharge Summary (Signed)
[facility] [place] at [facility]   PATIENT NAME: Melanie Tanner    MR#:  [id number]  DATE OF BIRTH:  [birth year]  DATE OF ADMISSION:  03/11/2016   ADMITTING PHYSICIAN: Shaune Pollack, MD  DATE OF DISCHARGE: 03/15/16  PRIMARY CARE PHYSICIAN: Pcp Not In System   ADMISSION DIAGNOSIS:   Petechiae [R23.3] Thrombocytopenia (HCC) [D69.6] Fever, unspecified fever cause [R50.9] Altered mental status, unspecified altered mental status type [R41.82]  DISCHARGE DIAGNOSIS:   Active Problems:   Altered mental status   Malnutrition of moderate degree   DNR (do not resuscitate)   Palliative care by specialist   SECONDARY DIAGNOSIS:   Past Medical History:  Diagnosis Date  . Atrial fibrillation (HCC)   . Dementia   . Hypertension   . Thyroid disease     HOSPITAL COURSE:   82 year old female with past medical history significant for A. Fib, hypertension, hypothyroidism, dementia presents to the hospital secondary to altered mental status. Patient has been clinically declining over the last couple of months. She has had UTIs and admissions to the hospital. This admission, no reason for her encephalopathy has been found. She just stopped eating, she remains alert but not really interacting. MRI of the brain showed atrophy but no acute findings. Seen by neurology as well. -No evidence of any infection. Developed significant rash to antibiotics. -Electrolytes were replaced appropriately initially. Blood pressure remained elevated. However patient not taking any oral medications at this time. -Palliative care has been consulted. Family agreeable for hospice services at this time which is appropriate. -Patient will be discharged [facility] to [facility] with [facility].  Her final diagnosis are: #1 metabolic encephalopathy #2 worsening dementia #3 drug rash #4 hypokalemia #5 hypertension #6 atrial fibrillation.    DISCHARGE CONDITIONS:   Critical  CONSULTS  OBTAINED:   Treatment Team:  Kym Groom, MD  DRUG ALLERGIES:   Allergies  Allergen Reactions  . Amoxicillin     Other reaction(s): Unknown  . Iodine     Other reaction(s): Unknown  . Shellfish-Derived Products     Other reaction(s): Unknown   DISCHARGE MEDICATIONS:     Medication List    STOP taking these medications   acetaminophen 500 MG tablet Commonly known as:  TYLENOL Replaced by:  acetaminophen 650 MG suppository   amitriptyline 10 MG tablet Commonly known as:  ELAVIL   azithromycin 250 MG tablet Commonly known as:  ZITHROMAX   digoxin 0.125 MG tablet Commonly known as:  LANOXIN   diphenhydrAMINE 25 MG tablet Commonly known as:  BENADRYL   feeding supplement (ENSURE ENLIVE) Liqd   levothyroxine 75 MCG tablet Commonly known as:  SYNTHROID, LEVOTHROID   lisinopril 30 MG tablet Commonly known as:  PRINIVIL,ZESTRIL   metoprolol succinate 25 MG 24 hr tablet Commonly known as:  TOPROL-XL   mirtazapine 15 MG tablet Commonly known as:  REMERON   POLYETHYLENE GLYCOL 3350 PO   senna-docusate 8.6-50 MG tablet Commonly known as:  Senokot-S     TAKE these medications   acetaminophen 650 MG suppository Commonly known as:  TYLENOL Place 1 suppository (650 mg total) rectally every 6 (six) hours as needed for mild pain (or Fever >/= 101). Replaces:  acetaminophen 500 MG tablet   cloNIDine 0.2 mg/24hr patch Commonly known as:  CATAPRES - Dosed in mg/24 hr Place 1 patch (0.2 mg total) onto the skin once a week.   morphine CONCENTRATE 10 MG/0.5ML Soln concentrated solution Take 0.25 mLs (5 mg total) by  mouth every 8 (eight) hours.   morphine CONCENTRATE 10 MG/0.5ML Soln concentrated solution Take 0.25 mLs (5 mg total) by mouth every hour as needed for moderate pain, severe pain or shortness of breath.        DISCHARGE INSTRUCTIONS:   1. Discharge to [facility] with hospice  DIET:   As tolerated  ACTIVITY:   Bedrest  OXYGEN:   Home  Oxygen: No.  Oxygen Delivery: room air  DISCHARGE LOCATION:   [facility] with hosp[facility]   If you experience worsening of your admission symptoms, develop shortness of breath, life threatening emergency, suicidal or homicidal thoughts you must seek medical attention immediately by calling 911 or calling your MD immediately  if symptoms less severe.  You Must read complete instructions/literature along with all the possible adverse reactions/side effects for all the Medicines you take and that have been prescribed to you. Take any new Medicines after you have completely understood and accpet all the possible adverse reactions/side effects.   Please note  You were cared for by a hospitalist during your hospital stay. If you have any questions about your discharge medications or the care you received while you were in the hospital after you are discharged, you can call the unit and asked to speak with the hospitalist on call if the hospitalist that took care of you is not available. Once you are discharged, your primary care physician will handle any further medical issues. Please note that NO REFILLS for any discharge medications will be authorized once you are discharged, as it is imperative that you return to your primary care physician (or establish a relationship with a primary care physician if you do not have one) for your aftercare needs so that they can reassess your need for medications and monitor your lab values.    On the day of Discharge:  VITAL SIGNS:   Blood pressure (!) 189/65, pulse 72, temperature 98.9 F (37.2 C), temperature source Oral, resp. rate 15, height 5' (1.524 m), weight 42.9 kg (94 lb 8 oz), SpO2 97 %.  PHYSICAL EXAMINATION:   GENERAL:  82 y.o.-year-old patient lying in the bed with no acute distress. But moans as soon as you touch her EYES: Pupils equal, round, reactive to light and accommodation. No scleral icterus.  HEENT: Head atraumatic, normocephalic.  Oropharynx and nasopharynx clear.  NECK:  Supple, no jugular venous distention. No thyroid enlargement, no tenderness.  LUNGS: Normal breath sounds bilaterally, no wheezing, rales,rhonchi or crepitation. No use of accessory muscles of respiration. Decreased bibasilar breath sounds CARDIOVASCULAR: S1, S2 normal. No  rubs, or gallops. 2/6 systolic murmur is present ABDOMEN: Soft, nontender, nondistended. Bowel sounds present. No organomegaly or mass.  EXTREMITIES: No pedal edema, cyanosis, or clubbing.  NEUROLOGIC: Appears globally weak. Not following commands. Moaning in pain  Unable to do a complete neuro exam. PSYCHIATRIC: The patient is lethargic, but easily gets aroused, not following commands, non verbal, moaning   SKIN: fading macular rash in both axilla tracing to lower back, also noted in suprapubic region.  DATA REVIEW:   CBC  Recent Labs Lab 03/13/16 0439  WBC 7.3  HGB 10.9*  HCT 31.1*  PLT 120*    Chemistries   Recent Labs Lab 03/11/16 1022 03/11/16 1449  03/13/16 0439  NA 138  --   < > 141  K 3.5  --   < > 3.1*  CL 101  --   < > 105  CO2 29  --   < >  25  GLUCOSE 125*  --   < > 88  BUN 33*  --   < > 24*  CREATININE 0.79  --   < > 0.79  CALCIUM 9.1  --   < > 8.5*  MG  --  2.0  --   --   AST 18  --   --   --   ALT 14  --   --   --   ALKPHOS 77  --   --   --   BILITOT 0.7  --   --   --   < > = values in this interval not displayed.   Microbiology Results  Results for orders placed or performed during the hospital encounter of 03/11/16  Blood culture (routine x 2)     Status: None (Preliminary result)   Collection Time: 03/11/16 10:22 AM  Result Value Ref Range Status   Specimen Description BLOOD LEFT WRIST  Final   Special Requests BOTTLES DRAWN AEROBIC AND ANAEROBIC  2CC  Final   Culture NO GROWTH 4 DAYS  Final   Report Status PENDING  Incomplete  Blood culture (routine x 2)     Status: None (Preliminary result)   Collection Time: 03/11/16 10:22 AM    Result Value Ref Range Status   Specimen Description BLOOD LEFT FOREARM  Final   Special Requests BOTTLES DRAWN AEROBIC AND ANAEROBIC  1CC  Final   Culture NO GROWTH 4 DAYS  Final   Report Status PENDING  Incomplete  MRSA PCR Screening     Status: None   Collection Time: 03/11/16  3:19 PM  Result Value Ref Range Status   MRSA by PCR NEGATIVE NEGATIVE Final    Comment:        The GeneXpert MRSA Assay (FDA approved for NASAL specimens only), is one component of a comprehensive MRSA colonization surveillance program. It is not intended to diagnose MRSA infection nor to guide or monitor treatment for MRSA infections.     RADIOLOGY:  Mr Brain 65Wo Contrast  Result Date: 03/14/2016 CLINICAL DATA:  History of hypertension, and dementia, with confusion and altered mental status. Recent UTI. EXAM: MRI HEAD WITHOUT CONTRAST TECHNIQUE: Multiplanar, multiecho pulse sequences of the brain and surrounding structures were obtained without intravenous contrast. COMPARISON:  CT head 03/11/2016. FINDINGS: Brain: No acute infarction, hemorrhage, extra-axial collection or mass lesion. Advanced atrophy with hydrocephalus ex vacuo. Extensive T2 and FLAIR hyperintensities throughout the white matter consistent with chronic microvascular ischemic change. Vascular: Flow voids are maintained throughout the carotid, basilar, and vertebral arteries. Susceptibility focus involving the LEFT cerebellar tonsil, difficult to see on T1 and T2 weighted images, could represent sequelae of small cavernoma or remote infarct. Skull and upper cervical spine: Unremarkable visualized calvarium, skullbase, and cervical vertebrae. Pituitary, pineal, cerebellar tonsils unremarkable. No upper cervical cord lesions. Sinuses/Orbits: No orbital masses or proptosis. Globes appear symmetric, except for RIGHT cataract extraction. Sinuses appear well aerated, without evidence for air-fluid level. Other: BILATERAL mastoid effusions, non  worrisome. No nasopharyngeal process appear Compared with prior CT, good general agreement. No evidence for acute hemorrhage on prior CT. IMPRESSION: Advanced atrophy and small vessel disease similar to priors. No acute intracranial findings. Electronically Signed   By: Elsie StainJohn T Curnes M.D.   On: 03/14/2016 12:15     Management plans discussed with the patient, family and they are in agreement.  CODE STATUS:     Code Status Orders        Start  Ordered   03/11/16 1436  Do not attempt resuscitation (DNR)  Continuous    Question Answer Comment  In the event of cardiac or respiratory ARREST Do not call a "code blue"   In the event of cardiac or respiratory ARREST Do not perform Intubation, CPR, defibrillation or ACLS   In the event of cardiac or respiratory ARREST Use medication by any route, position, wound care, and other measures to relive pain and suffering. May use oxygen, suction and manual treatment of airway obstruction as needed for comfort.      03/11/16 1435    Code Status History    Date Active Date Inactive Code Status Order ID Comments User Context   11/21/2015  8:09 AM 11/23/2015  8:59 PM DNR [id number]  Arnaldo Natal, MD Inpatient   11/21/2015  5:33 AM 11/21/2015  8:09 AM DNR [id number]  Loleta Rose, MD ED   08/22/2015  4:09 AM 08/24/2015  7:06 PM Full Code [id number]  Ihor Austin, MD Inpatient      TOTAL TIME TAKING CARE OF THIS PATIENT: 37 minutes.    Enid Baas M.D on 03/15/2016 at 10:27 AM  Between 7am to 6pm - Pager - [phone]  After 6pm go to [email or web address] - Social research officer, government  [facility] [place] [facility]  Office  [phone]  CC: Primary care physician; Pcp Not In System   Note: This dictation was prepared with Dragon dictation along with smaller phrase technology. Any transcriptional errors that result from this process are unintentional.

## 2016-03-16 LAB — CULTURE, BLOOD (ROUTINE X 2)
Culture: NO GROWTH
Culture: NO GROWTH

## 2016-04-02 DEATH — deceased
